# Patient Record
Sex: Female | Born: 1990 | Hispanic: Yes | Marital: Married | Smoking: Never smoker
Health system: Southern US, Community
[De-identification: ages and names within clinical notes are randomized; demographics above are authoritative.]

## PROBLEM LIST (undated history)

## (undated) DIAGNOSIS — O24419 Gestational diabetes mellitus in pregnancy, unspecified control: Secondary | ICD-10-CM

## (undated) HISTORY — DX: Gestational diabetes mellitus in pregnancy, unspecified control: O24.419

## (undated) HISTORY — PX: TONGUE SURGERY: SHX810

## (undated) HISTORY — PX: FACIAL LACERATION REPAIR: SHX6589

---

## 2012-06-15 ENCOUNTER — Observation Stay: Payer: Self-pay

## 2012-06-17 ENCOUNTER — Inpatient Hospital Stay: Payer: Self-pay | Admitting: Obstetrics and Gynecology

## 2012-06-17 LAB — CBC WITH DIFFERENTIAL/PLATELET
Basophil #: 0 10*3/uL (ref 0.0–0.1)
HCT: 31.4 % — ABNORMAL LOW (ref 35.0–47.0)
HGB: 10.4 g/dL — ABNORMAL LOW (ref 12.0–16.0)
Lymphocyte %: 12 %
MCH: 25.7 pg — ABNORMAL LOW (ref 26.0–34.0)
MCHC: 33.1 g/dL (ref 32.0–36.0)
MCV: 78 fL — ABNORMAL LOW (ref 80–100)
Monocyte #: 0.5 x10 3/mm (ref 0.2–0.9)
Monocyte %: 6 %
Neutrophil #: 6.4 10*3/uL (ref 1.4–6.5)
Platelet: 232 10*3/uL (ref 150–440)
WBC: 8 10*3/uL (ref 3.6–11.0)

## 2012-06-19 LAB — HEMATOCRIT: HCT: 26.1 % — ABNORMAL LOW (ref 35.0–47.0)

## 2013-06-28 ENCOUNTER — Ambulatory Visit: Payer: Self-pay | Admitting: Family Medicine

## 2013-06-28 LAB — HCG, QUANTITATIVE, PREGNANCY: Beta Hcg, Quant.: <1 m[IU]/mL — ABNORMAL LOW

## 2014-08-15 NOTE — Discharge Summary (Signed)
Dates of Admission and Diagnosis:  Date of Admission 17-Jun-2012   Date of Discharge 19-Jun-2012   Admitting Diagnosis labor   Final Diagnosis delivery    Chief Complaint/History of Present Illness labor   Chief Complaint/History of Present Illness cont'd labor   Hospital Course:  Hospital Course delivered, no troubles   Condition on Discharge Guarded   DISCHARGE INSTRUCTIONS HOME MEDS:  Medication Reconciliation: Patient's Home Medications at Discharge:     Physician's Instructions:  Diet Regular   Activity Limitations no sex   Return to Work 6 weeks   Time frame for Follow Up Appointment 2-4 weeks  4-6 weeks   Electronic Signatures: Margaretha GlassingEvans, Ricky L (MD)  (Signed 25-Feb-14 08:50)  Authored: ADMISSION DATE AND DIAGNOSIS, CHIEF COMPLAINT/HPI, HOSPITAL COURSE, DISCHARGE INSTRUCTIONS HOME MEDS, PATIENT INSTRUCTIONS   Last Updated: 25-Feb-14 08:50 by Margaretha GlassingEvans, Ricky L (MD)

## 2014-09-02 NOTE — H&P (Signed)
L&D Evaluation:  History:  HPI 24yo G1P0 with PNC at [facility] significant for "PPROM today for clear fluid", pt was seen on 06/15/12 for ?ROM but, had no ferning. Now copious leak. No VB, decreased FM, +UC's q 3-4 mins. PNC significant for Varicella non-immune and Tdap given at 28 wweeks. LMP of 10/03/11 & dating scan at 16 5/7 weeks with EDD of 07/09/12.   Presents with leaking fluid   Patient's Medical History No Chronic Illness   Patient's Surgical History none   Medications Pre Natal Vitamins   Allergies NKDA   Social History none   Family History Non-Contributory   ROS:  ROS All systems were reviewed.  HEENT, CNS, GI, GU, Respiratory, CV, Renal and Musculoskeletal systems were found to be normal.   Exam:  Vital Signs stable  Afebrile   General no apparent distress   Mental Status clear   Chest clear   Heart normal sinus rhythm, no murmur/gallop/rubs   Estimated Fetal Weight Average for gestational age   Fetal Position vtx   Back no CVAT   Reflexes 1+   Pelvic 3/80/vtx-2 very post cx   Mebranes Ruptured   FHT normal rate with no decels, Cat I, Accels seen   Ucx regular   Skin dry   Lymph no lymphadenopathy   Impression:  Impression early labor, IUP at 36 4/7 weeksn with PPROM   Plan:  Plan monitor contractions and for cervical change, Admit for delivery. Plans epidural   Comments Interpreter in with pt during exams   Electronic Signatures: Sharee PimpleJones, Gilad Dugger W (CNM)  (Signed 23-Feb-14 15:16)  Authored: L&D Evaluation   Last Updated: 23-Feb-14 15:16 by Sharee PimpleJones, Meya Clutter W (CNM)

## 2014-09-02 NOTE — H&P (Signed)
L&D Evaluation:  History:  HPI 24yo G1P0 with PNC at [facility] significant for "?ROM on 12 days ago" sent from [facility] for eval. Pt also being treated for yeast. No UC,dcreased fetal movement or vag bleeding today. PNC significant for Varicella non-immune and Tdap given at 28 weeks. LMP  was 10/03/11 & EDD of 07/09/12 confirmed by US at 16 5/7 weeks.   Presents with leaking fluid   Patient's Medical History No Chronic Illness   Patient's Surgical History none   Medications Pre Natal Vitamins   Allergies NKDA   Social History none   Family History Non-Contributory   ROS:  ROS All systems were reviewed.  HEENT, CNS, GI, GU, Respiratory, CV, Renal and Musculoskeletal systems were found to be normal.   Exam:  Vital Signs stable   General no apparent distress   Mental Status clear   Chest clear   Heart normal sinus rhythm, no murmur/gallop/rubs   Abdomen gravid, non-tender   Estimated Fetal Weight Average for gestational age   Fetal Position vtx   Back no CVAT   Edema no edema   Reflexes 1+   Pelvic FT/long   Mebranes Intact, Neg ferning, +sl pos nitrazine but, after exam and bloody dc seen on Qtip   FHT normal rate with no decels   Ucx absent   Skin dry   Lymph no lymphadenopathy   Impression:  Impression IUP at 36 4/7 weeks AFI 9.98   Plan:  Plan EFM/NST   Comments Fern neg with spec exaam. No fluid seen with a cough but, there is clear mucous at the cx os. There is greenish cottage cheese dc on the vag walls. DC after NST reactive. AFI does not indicate Oligo  and there are no signs of ROM today. Pt is afebrile.   Electronic Signatures: Sharee PimpleJones, Torrin Crihfield W (CNM)  (Signed 21-Feb-14 13:11)  Authored: L&D Evaluation   Last Updated: 21-Feb-14 13:11 by Sharee PimpleJones, Fradel Baldonado W (CNM)

## 2016-07-08 ENCOUNTER — Encounter: Payer: Self-pay | Admitting: Certified Nurse Midwife

## 2016-07-15 ENCOUNTER — Encounter: Payer: Self-pay | Admitting: Emergency Medicine

## 2016-07-15 ENCOUNTER — Emergency Department
Admission: EM | Admit: 2016-07-15 | Discharge: 2016-07-15 | Disposition: A | Discharge status: Home / Self Care | Payer: BLUE CROSS/BLUE SHIELD | Attending: Emergency Medicine | Admitting: Emergency Medicine

## 2016-07-15 DIAGNOSIS — R1031 Right lower quadrant pain: Secondary | ICD-10-CM | POA: Diagnosis present

## 2016-07-15 DIAGNOSIS — N390 Urinary tract infection, site not specified: Secondary | ICD-10-CM | POA: Insufficient documentation

## 2016-07-15 LAB — COMPREHENSIVE METABOLIC PANEL
ALT: 28 U/L (ref 14–54)
AST: 25 U/L (ref 15–41)
Albumin: 4.7 g/dL (ref 3.5–5.0)
Alkaline Phosphatase: 77 U/L (ref 38–126)
Anion gap: 9 (ref 5–15)
BUN: 10 mg/dL (ref 6–20)
CHLORIDE: 101 mmol/L (ref 101–111)
CO2: 27 mmol/L (ref 22–32)
Calcium: 9.7 mg/dL (ref 8.9–10.3)
Creatinine, Ser: 0.72 mg/dL (ref 0.44–1.00)
Glucose, Bld: 87 mg/dL (ref 65–99)
POTASSIUM: 4 mmol/L (ref 3.5–5.1)
Sodium: 137 mmol/L (ref 135–145)
Total Bilirubin: 0.5 mg/dL (ref 0.3–1.2)
Total Protein: 8.2 g/dL — ABNORMAL HIGH (ref 6.5–8.1)

## 2016-07-15 LAB — URINALYSIS, COMPLETE (UACMP) WITH MICROSCOPIC
BILIRUBIN URINE: NEGATIVE
Glucose, UA: NEGATIVE mg/dL
KETONES UR: NEGATIVE mg/dL
Nitrite: NEGATIVE
Protein, ur: 30 mg/dL — AB
Specific Gravity, Urine: 1.005 (ref 1.005–1.030)
pH: 7 (ref 5.0–8.0)

## 2016-07-15 LAB — CBC
HEMATOCRIT: 42.4 % (ref 35.0–47.0)
HEMOGLOBIN: 14.5 g/dL (ref 12.0–16.0)
MCH: 29.1 pg (ref 26.0–34.0)
MCHC: 34.3 g/dL (ref 32.0–36.0)
MCV: 84.9 fL (ref 80.0–100.0)
Platelets: 232 10*3/uL (ref 150–440)
RBC: 4.99 MIL/uL (ref 3.80–5.20)
RDW: 13.2 % (ref 11.5–14.5)
WBC: 6.7 10*3/uL (ref 3.6–11.0)

## 2016-07-15 LAB — LIPASE, BLOOD: LIPASE: 25 U/L (ref 11–51)

## 2016-07-15 MED ORDER — CEPHALEXIN 500 MG PO CAPS
ORAL_CAPSULE | ORAL | Status: AC
  Administered 2016-07-15: 500 mg via ORAL
  Filled 2016-07-15: qty 1

## 2016-07-15 MED ORDER — CEPHALEXIN 500 MG PO CAPS
500.0000 mg | ORAL_CAPSULE | Freq: Once | ORAL | Status: AC
Start: 2016-07-15 — End: 2016-07-15
  Administered 2016-07-15: 500 mg via ORAL

## 2016-07-15 MED ORDER — CEPHALEXIN 500 MG PO CAPS: 500.0000 mg | ORAL_CAPSULE | Freq: Two times a day (BID) | ORAL | 0 refills | Status: DC

## 2016-07-15 NOTE — ED Notes (Signed)
Electronic signature pad not working at this time. Interpreter used. Patient verbalizes understanding of discharge instructions. All questions answered.

## 2016-07-15 NOTE — ED Notes (Signed)
With the use of the interpreter the patient explained that this has been an ongoing pain for 1 month but started getting significantly worse on Monday.  Patient started vomiting Monday after each meal.  Patient states that after she eats she has sharp pain,  bloating, and nausea.

## 2016-07-15 NOTE — ED Provider Notes (Signed)
[facility] Emergency Department Provider Note   ____________________________________________    I have reviewed the triage vital signs and the nursing notes.   HISTORY  Chief Complaint Emesis and Abdominal Pain  Spanish interpreter used   HPI Tracy Mata is a 26 y.o. female who presents with complaints of right lower abdominal pain primarily over the last week. She saw her PCP who gave her nausea medication because she had been having mild nausea with it but she reports the pain is continued. She denies fevers or chills. She does report dysuria and frequency. No back pain.   History reviewed. No pertinent past medical history.  There are no active problems to display for this patient.   History reviewed. No pertinent surgical history.  Prior to Admission medications   Medication Sig Start Date End Date Taking? Authorizing Provider  cephALEXin (KEFLEX) 500 MG capsule Take 1 capsule (500 mg total) by mouth 2 (two) times daily. 07/15/16   Jene Everyobert Zamara Cozad, MD     Allergies Patient has no known allergies.  No family history on file.  Social History Social History  Substance Use Topics  . Smoking status: Never Smoker  . Smokeless tobacco: Never Used  . Alcohol use No    Review of Systems  Constitutional: No fever/chills  Cardiovascular: Denies chest pain. Respiratory: Denies shortness of breath. Gastrointestinal:As above Genitourinary: As above, no vaginal discharge Musculoskeletal: Negative for back pain. Skin: Negative for rash. Neurological: Negative for headaches  10-point ROS otherwise negative.  ____________________________________________   PHYSICAL EXAM:  VITAL SIGNS: ED Triage Vitals [07/15/16 1705]  Enc Vitals Group     BP 114/89     Pulse Rate 97     Resp 16     Temp 98.5 F (36.9 C)     Temp Source Oral     SpO2 100 %     Weight 145 lb (65.8 kg)     Height      Head Circumference      Peak Flow        Pain Score 9     Pain Loc      Pain Edu?      Excl. in GC?     Constitutional: Alert and oriented. No acute distress. Pleasant and interactive Eyes: Conjunctivae are normal.   Nose: No congestion/rhinnorhea. Mouth/Throat: Mucous membranes are moist.    Cardiovascular: Normal rate, regular rhythm. Grossly normal heart sounds.  Good peripheral circulation. Respiratory: Normal respiratory effort.  No retractions. Lungs CTAB. Gastrointestinal: Mild tenderness to palpation suprapubically and in the right lower quadrant No distention.  No CVA tenderness. Genitourinary: deferred Musculoskeletal:  Warm and well perfused Neurologic:  Normal speech and language. No gross focal neurologic deficits are appreciated.  Skin:  Skin is warm, dry and intact. No rash noted. Psychiatric: Mood and affect are normal. Speech and behavior are normal.  ____________________________________________   LABS (all labs ordered are listed, but only abnormal results are displayed)  Labs Reviewed  COMPREHENSIVE METABOLIC PANEL - Abnormal; Notable for the following:       Result Value   Total Protein 8.2 (*)    All other components within normal limits  URINALYSIS, COMPLETE (UACMP) WITH MICROSCOPIC - Abnormal; Notable for the following:    Color, Urine YELLOW (*)    APPearance TURBID (*)    Hgb urine dipstick SMALL (*)    Protein, ur 30 (*)    Leukocytes, UA LARGE (*)    Bacteria, UA  MANY (*)    Squamous Epithelial / LPF TOO NUMEROUS TO COUNT (*)    All other components within normal limits  LIPASE, BLOOD  CBC   ____________________________________________  EKG  None ____________________________________________  RADIOLOGY  None ____________________________________________   PROCEDURES  Procedure(s) performed: No    Critical Care performed: No ____________________________________________   INITIAL IMPRESSION / ASSESSMENT AND PLAN / ED COURSE  Pertinent labs & imaging results that  were available during my care of the patient were reviewed by me and considered in my medical decision making (see chart for details).  Patient well-appearing and in no acute distress. Vital signs normal. Exam significant for mild to palpation suprapubically and in the right lower quadrant. Lab work is unremarkable except for large leukocytes/white blood cells in urine. History of present illness is not consistent with appendicitis. I suspect urinary tract infection as the cause of her pain. We will treat with by mouth Keflex with strict return precautions if any worsening symptoms. Patient is comfortable with this plan ____________________________________________   FINAL CLINICAL IMPRESSION(S) / ED DIAGNOSES  Final diagnoses:  Lower urinary tract infectious disease      NEW MEDICATIONS STARTED DURING THIS VISIT:  New Prescriptions   CEPHALEXIN (KEFLEX) 500 MG CAPSULE    Take 1 capsule (500 mg total) by mouth 2 (two) times daily.     Note:  This document was prepared using Dragon voice recognition software and may include unintentional dictation errors.    Jene Every, MD 07/15/16 [phone]

## 2016-07-15 NOTE — ED Notes (Signed)
ED Provider at bedside. 

## 2016-07-15 NOTE — ED Triage Notes (Signed)
Pt states that she was seen by her doctor on Tuesday for abdominal pain, N/V. Pt states that she was told to come to ED if her pain got worse. Pt states that she has been unable to keep any food down, only water. Pt denies fever or diarrhea. Pt states that she feels like her stomach is swollen.

## 2017-04-25 NOTE — L&D Delivery Note (Addendum)
Delivery Note  [phone] Called in room to see patient, head crowning. Effective maternal pushing efforts noted.   Spontaneous vaginal birth of liveborn female infant (gestational age: 6139 weeks) in right occiput anterior position, through single loose nuchal cord, over intact perineum at 0841. Infant immediately to maternal abdomen. Delayed cord clamping, three (3) vessel cord, and cord blood tubes collected. APGARs: 7, 9. Weight pending. Receiving nurse present at bedside for birth.   Pitocin infusing. Spontaneous delivery of intact placenta at 0846. Vaginal skidmarks hemostatic, unrepaired. EBL: 150 ml. Epidural anesthesia. Vault check completed. Counts correct x 2. Uterus firm. Lochia small.   Initiate routine postpartum care and orders. Mom to postpartum.  Baby to Couplet care / Skin to Skin.  Family members present at bedside and overjoyed with the birth of "Tracy Mata".    Gunnar BullaJenkins Michelle Dorr Perrot, CNM [facility], [facility] 01/14/2018, 9:03 AM

## 2017-07-19 ENCOUNTER — Other Ambulatory Visit (INDEPENDENT_AMBULATORY_CARE_PROVIDER_SITE_OTHER): Payer: BLUE CROSS/BLUE SHIELD

## 2017-07-19 ENCOUNTER — Ambulatory Visit (INDEPENDENT_AMBULATORY_CARE_PROVIDER_SITE_OTHER): Payer: BLUE CROSS/BLUE SHIELD | Admitting: Certified Nurse Midwife

## 2017-07-19 ENCOUNTER — Encounter: Payer: Self-pay | Admitting: Certified Nurse Midwife

## 2017-07-19 ENCOUNTER — Other Ambulatory Visit: Payer: Self-pay | Admitting: Certified Nurse Midwife

## 2017-07-19 VITALS — BP 116/70 | HR 112 | Ht 60.0 in | Wt 147.5 lb

## 2017-07-19 DIAGNOSIS — N926 Irregular menstruation, unspecified: Secondary | ICD-10-CM

## 2017-07-19 DIAGNOSIS — Z3481 Encounter for supervision of other normal pregnancy, first trimester: Secondary | ICD-10-CM | POA: Diagnosis not present

## 2017-07-19 LAB — POCT URINALYSIS DIPSTICK
Bilirubin, UA: NEGATIVE
Blood, UA: NEGATIVE
Glucose, UA: NEGATIVE
KETONES UA: NEGATIVE
Leukocytes, UA: NEGATIVE
NITRITE UA: NEGATIVE
PH UA: 6 (ref 5.0–8.0)
Protein, UA: NEGATIVE
SPEC GRAV UA: 1.01 (ref 1.010–1.025)
UROBILINOGEN UA: 0.2 U/dL

## 2017-07-19 LAB — OB RESULTS CONSOLE VARICELLA ZOSTER ANTIBODY, IGG: Varicella: IMMUNE

## 2017-07-19 LAB — OB RESULTS CONSOLE HEPATITIS B SURFACE ANTIGEN: Hepatitis B Surface Ag: NEGATIVE

## 2017-07-19 NOTE — Progress Notes (Signed)
Pt is here for a new OB visit. Denies N/V. LMP 04/16/17. [facility].Pt states she feels like something falling out especially at work.

## 2017-07-19 NOTE — Progress Notes (Signed)
NEW OB HISTORY AND PHYSICAL  SUBJECTIVE:       Tracy Mata is a 27 y.o. G1P0 female, Patient's last menstrual period was 04/16/2017 (approximate)., Estimated Date of Delivery: None noted., Unknown, presents today for establishment of Prenatal Care. She has no unusual complaints.    Gynecologic History Patient's last menstrual period was 04/16/2017 (approximate). Normal Contraception: none Last Pap:2018 per pt. Results were: normal  Obstetric History OB History  Gravida Para Term Preterm AB Living  2 1   1   1   SAB TAB Ectopic Multiple Live Births          1    # Outcome Date GA Lbr Len/2nd Weight Sex Delivery Anes PTL Lv  2 Current           1 Preterm 2014   8 lb 11 oz (3.941 kg) M Vag-Spont  Y LIV    History reviewed. No pertinent past medical history.  Past Surgical History:  Procedure Laterality Date  . FACIAL LACERATION REPAIR    . TONGUE SURGERY      Current Outpatient Medications on File Prior to Visit  Medication Sig Dispense Refill  . Prenatal Vit-Fe Fumarate-FA (PRENATAL MULTIVITAMIN) TABS tablet Take 1 tablet by mouth daily at 12 noon.     No current facility-administered medications on file prior to visit.     No Known Allergies  Social History   Socioeconomic History  . Marital status: Married    Spouse name: Not on file  . Number of children: Not on file  . Years of education: Not on file  . Highest education level: Not on file  Occupational History  . Not on file  Social Needs  . Financial resource strain: Not on file  . Food insecurity:    Worry: Not on file    Inability: Not on file  . Transportation needs:    Medical: Not on file    Non-medical: Not on file  Tobacco Use  . Smoking status: Never Smoker  . Smokeless tobacco: Never Used  Substance and Sexual Activity  . Alcohol use: No  . Drug use: Never  . Sexual activity: Yes    Birth control/protection: None  Lifestyle  . Physical activity:    Days per week: Not on file     Minutes per session: Not on file  . Stress: Not on file  Relationships  . Social connections:    Talks on phone: Not on file    Gets together: Not on file    Attends religious service: Not on file    Active member of club or organization: Not on file    Attends meetings of clubs or organizations: Not on file    Relationship status: Not on file  . Intimate partner violence:    Fear of current or ex partner: Not on file    Emotionally abused: Not on file    Physically abused: Not on file    Forced sexual activity: Not on file  Other Topics Concern  . Not on file  Social History Narrative  . Not on file    Family History  Problem Relation Age of Onset  . Heart disease Paternal Grandmother   . Diabetes Paternal Grandfather     The following portions of the patient's history were reviewed and updated as appropriate: allergies, current medications, past OB history, past medical history, past surgical history, past family history, past social history, and problem list.    OBJECTIVE: Initial Physical  Exam (New OB)  GENERAL APPEARANCE: alert, well appearing, in no apparent distress, oriented to person, place and time, overweight HEAD: normocephalic, atraumatic MOUTH: mucous membranes moist, pharynx normal without lesions THYROID: no thyromegaly or masses present BREASTS: no masses noted, no significant tenderness, no palpable axillary nodes, no skin changes LUNGS: clear to auscultation, no wheezes, rales or rhonchi, symmetric air entry HEART: regular rate and rhythm, no murmurs ABDOMEN: soft, nontender, nondistended, no abnormal masses, no epigastric pain, obese and FHT present EXTREMITIES: no redness or tenderness in the calves or thighs, no edema, no limitation in range of motion, intact peripheral pulses SKIN: normal coloration and turgor, no rashes LYMPH NODES: no adenopathy palpable NEUROLOGIC: alert, oriented, normal speech, no focal findings or movement disorder  noted  PELVIC EXAM EXTERNAL GENITALIA: normal appearing vulva with no masses, tenderness or lesions VAGINA: no abnormal discharge or lesions CERVIX: no lesions or cervical motion tenderness UTERUS: gravid ADNEXA: no masses palpable and nontender OB EXAM PELVIMETRY: appears adequate RECTUM: exam not indicated  ASSESSMENT: Normal pregnancy  PLAN: New OB counseling: The patient has been given an overview regarding routine prenatal care. Recommendations regarding diet, weight gain, and exercise in pregnancy were given. Prenatal testing, optional genetic testing, and ultrasound use in pregnancy were reviewed.  Benefits of Breast Feeding were discussed. Discussed use of 17 P given her history of preterm birth @ 8 months. Teacher, English as a foreign language given. The patient is encouraged to consider nursing her baby post partum.    New ob H&P completed with use of spanish interpretor.   Doreene Burke, CNM

## 2017-07-19 NOTE — Patient Instructions (Signed)
Eating Plan for Pregnant Women While you are pregnant, your body will require additional nutrition to help support your growing baby. It is recommended that you consume:  150 additional calories each day during your first trimester.  300 additional calories each day during your second trimester.  300 additional calories each day during your third trimester.  Eating a healthy, well-balanced diet is very important for your health and for your baby's health. You also have a higher need for some vitamins and minerals, such as folic acid, calcium, iron, and vitamin D. What do I need to know about eating during pregnancy?  Do not try to lose weight or go on a diet during pregnancy.  Choose healthy, nutritious foods. Choose  of a sandwich with a glass of milk instead of a candy bar or a high-calorie sugar-sweetened beverage.  Limit your overall intake of foods that have "empty calories." These are foods that have little nutritional value, such as sweets, desserts, candies, sugar-sweetened beverages, and fried foods.  Eat a variety of foods, especially fruits and vegetables.  Take a prenatal vitamin to help meet the additional needs during pregnancy, specifically for folic acid, iron, calcium, and vitamin D.  Remember to stay active. Ask your health care provider for exercise recommendations that are specific to you.  Practice good food safety and cleanliness, such as washing your hands before you eat and after you prepare raw meat. This helps to prevent foodborne illnesses, such as listeriosis, that can be very dangerous for your baby. Ask your health care provider for more information about listeriosis. What does 150 extra calories look like? Healthy options for an additional 150 calories each day could be any of the following:  Plain low-fat yogurt (6-8 oz) with  cup of berries.  1 apple with 2 teaspoons of peanut butter.  Cut-up vegetables with  cup of hummus.  Low-fat chocolate milk  (8 oz or 1 cup).  1 string cheese with 1 medium orange.   of a peanut butter and jelly sandwich on whole-wheat bread (1 tsp of peanut butter).  For 300 calories, you could eat two of those healthy options each day. What is a healthy amount of weight to gain? The recommended amount of weight for you to gain is based on your pre-pregnancy BMI. If your pre-pregnancy BMI was:  Less than 18 (underweight), you should gain 28-40 lb.  18-24.9 (normal), you should gain 25-35 lb.  25-29.9 (overweight), you should gain 15-25 lb.  Greater than 30 (obese), you should gain 11-20 lb.  What if I am having twins or multiples? Generally, pregnant women who will be having twins or multiples may need to increase their daily calories by 300-600 calories each day. The recommended range for total weight gain is 25-54 lb, depending on your pre-pregnancy BMI. Talk with your health care provider for specific guidance about additional nutritional needs, weight gain, and exercise during your pregnancy. What foods can I eat? Grains Any grains. Try to choose whole grains, such as whole-wheat bread, oatmeal, or brown rice. Vegetables Any vegetables. Try to eat a variety of colors and types of vegetables to get a full range of vitamins and minerals. Remember to wash your vegetables well before eating. Fruits Any fruits. Try to eat a variety of colors and types of fruit to get a full range of vitamins and minerals. Remember to wash your fruits well before eating. Meats and Other Protein Sources Lean meats, including chicken, turkey, fish, and lean cuts of beef, veal,   or pork. Make sure that all meats are cooked to "well done." Tofu. Tempeh. Beans. Eggs. Peanut butter and other nut butters. Seafood, such as shrimp, crab, and lobster. If you choose fish, select types that are higher in omega-3 fatty acids, including salmon, herring, mussels, trout, sardines, and pollock. Make sure that all meats are cooked to food-safe  temperatures. Dairy Pasteurized milk and milk alternatives. Pasteurized yogurt and pasteurized cheese. Cottage cheese. Sour cream. Beverages Water. Juices that contain 100% fruit juice or vegetable juice. Caffeine-free teas and decaffeinated coffee. Drinks that contain caffeine are okay to drink, but it is better to avoid caffeine. Keep your total caffeine intake to less than 200 mg each day (12 oz of coffee, tea, or soda) or as directed by your health care provider. Condiments Any pasteurized condiments. Sweets and Desserts Any sweets and desserts. Fats and Oils Any fats and oils. The items listed above may not be a complete list of recommended foods or beverages. Contact your dietitian for more options. What foods are not recommended? Vegetables Unpasteurized (raw) vegetable juices. Fruits Unpasteurized (raw) fruit juices. Meats and Other Protein Sources Cured meats that have nitrates, such as bacon, salami, and hotdogs. Luncheon meats, bologna, or other deli meats (unless they are reheated until they are steaming hot). Refrigerated pate, meat spreads from a meat counter, smoked seafood that is found in the refrigerated section of a store. Raw fish, such as sushi or sashimi. High mercury content fish, such as tilefish, shark, swordfish, and king mackerel. Raw meats, such as tuna or beef tartare. Undercooked meats and poultry. Make sure that all meats are cooked to food-safe temperatures. Dairy Unpasteurized (raw) milk and any foods that have raw milk in them. Soft cheeses, such as feta, queso blanco, queso fresco, Brie, Camembert cheeses, blue-veined cheeses, and Panela cheese (unless it is made with pasteurized milk, which must be stated on the label). Beverages Alcohol. Sugar-sweetened beverages, such as sodas, teas, or energy drinks. Condiments Homemade fermented foods and drinks, such as pickles, sauerkraut, or kombucha drinks. (Store-bought pasteurized versions of these are  okay.) Other Salads that are made in the store, such as ham salad, chicken salad, egg salad, tuna salad, and seafood salad. The items listed above may not be a complete list of foods and beverages to avoid. Contact your dietitian for more information. This information is not intended to replace advice given to you by your health care provider. Make sure you discuss any questions you have with your health care provider. Document Released: 01/24/2014 Document Revised: 09/17/2015 Document Reviewed: 09/24/2013 Elsevier Interactive Patient Education  2018 [employer] Prenatal Care WHAT IS PRENATAL CARE? Prenatal care is the process of caring for a pregnant woman before she gives birth. Prenatal care makes sure that she and her baby remain as healthy as possible throughout pregnancy. Prenatal care may be provided by a midwife, family practice health care provider, or a childbirth and pregnancy specialist (obstetrician). Prenatal care may include physical examinations, testing, treatments, and education on nutrition, lifestyle, and social support services. WHY IS PRENATAL CARE SO IMPORTANT? Early and consistent prenatal care increases the chance that you and your baby will remain healthy throughout your pregnancy. This type of care also decreases a baby's risk of being born too early (prematurely), or being born smaller than expected (small for gestational age). Any underlying medical conditions you may have that could pose a risk during your pregnancy are discussed during prenatal care visits. You will also be monitored regularly for any new   conditions that may arise during your pregnancy so they can be treated quickly and effectively. WHAT HAPPENS DURING PRENATAL CARE VISITS? Prenatal care visits may include the following: Discussion Tell your health care provider about any new signs or symptoms you have experienced since your last visit. These might include:  Nausea or vomiting.  Increased or  decreased level of energy.  Difficulty sleeping.  Back or leg pain.  Weight changes.  Frequent urination.  Shortness of breath with physical activity.  Changes in your skin, such as the development of a rash or itchiness.  Vaginal discharge or bleeding.  Feelings of excitement or nervousness.  Changes in your baby's movements.  You may want to write down any questions or topics you want to discuss with your health care provider and bring them with you to your appointment. Examination During your first prenatal care visit, you will likely have a complete physical exam. Your health care provider will often examine your vagina, cervix, and the position of your uterus, as well as check your heart, lungs, and other body systems. As your pregnancy progresses, your health care provider will measure the size of your uterus and your baby's position inside your uterus. He or she may also examine you for early signs of labor. Your prenatal visits may also include checking your blood pressure and, after about 10-12 weeks of pregnancy, listening to your baby's heartbeat. Testing Regular testing often includes:  Urinalysis. This checks your urine for glucose, protein, or signs of infection.  Blood count. This checks the levels of white and red blood cells in your body.  Tests for sexually transmitted infections (STIs). Testing for STIs at the beginning of pregnancy is routinely done and is required in many states.  Antibody testing. You will be checked to see if you are immune to certain illnesses, such as rubella, that can affect a developing fetus.  Glucose screen. Around 24-28 weeks of pregnancy, your blood glucose level will be checked for signs of gestational diabetes. Follow-up tests may be recommended.  Group B strep. This is a bacteria that is commonly found inside a woman's vagina. This test will inform your health care provider if you need an antibiotic to reduce the amount of this  bacteria in your body prior to labor and childbirth.  Ultrasound. Many pregnant women undergo an ultrasound screening around 18-20 weeks of pregnancy to evaluate the health of the fetus and check for any developmental abnormalities.  HIV (human immunodeficiency virus) testing. Early in your pregnancy, you will be screened for HIV. If you are at high risk for HIV, this test may be repeated during your third trimester of pregnancy.  You may be offered other testing based on your age, personal or family medical history, or other factors. HOW OFTEN SHOULD I PLAN TO SEE MY HEALTH CARE PROVIDER FOR PRENATAL CARE? Your prenatal care check-up schedule depends on any medical conditions you have before, or develop during, your pregnancy. If you do not have any underlying medical conditions, you will likely be seen for checkups:  Monthly, during the first 6 months of pregnancy.  Twice a month during months 7 and 8 of pregnancy.  Weekly starting in the 9th month of pregnancy and until delivery.  If you develop signs of early labor or other concerning signs or symptoms, you may need to see your health care provider more often. Ask your health care provider what prenatal care schedule is best for you. WHAT CAN I DO TO KEEP MYSELF AND   MY BABY AS HEALTHY AS POSSIBLE DURING MY PREGNANCY?  Take a prenatal vitamin containing 400 micrograms (0.4 mg) of folic acid every day. Your health care provider may also ask you to take additional vitamins such as iodine, vitamin D, iron, copper, and zinc.  Take 1500-2000 mg of calcium daily starting at your 20th week of pregnancy until you deliver your baby.  Make sure you are up to date on your vaccinations. Unless directed otherwise by your health care provider: ? You should receive a tetanus, diphtheria, and pertussis (Tdap) vaccination between the 27th and 36th week of your pregnancy, regardless of when your last Tdap immunization occurred. This helps protect your baby  from whooping cough (pertussis) after he or she is born. ? You should receive an annual inactivated influenza vaccine (IIV) to help protect you and your baby from influenza. This can be done at any point during your pregnancy.  Eat a well-rounded diet that includes: ? Fresh fruits and vegetables. ? Lean proteins. ? Calcium-rich foods such as milk, yogurt, hard cheeses, and dark, leafy greens. ? Whole grain breads.  Do noteat seafood high in mercury, including: ? Swordfish. ? Tilefish. ? Shark. ? King mackerel. ? More than 6 oz tuna per week.  Do not eat: ? Raw or undercooked meats or eggs. ? Unpasteurized foods, such as soft cheeses (brie, blue, or feta), juices, and milks. ? Lunch meats. ? Hot dogs that have not been heated until they are steaming.  Drink enough water to keep your urine clear or pale yellow. For many women, this may be 10 or more 8 oz glasses of water each day. Keeping yourself hydrated helps deliver nutrients to your baby and may prevent the start of pre-term uterine contractions.  Do not use any tobacco products including cigarettes, chewing tobacco, or electronic cigarettes. If you need help quitting, ask your health care provider.  Do not drink beverages containing alcohol. No safe level of alcohol consumption during pregnancy has been determined.  Do not use any illegal drugs. These can harm your developing baby or cause a miscarriage.  Ask your health care provider or pharmacist before taking any prescription or over-the-counter medicines, herbs, or supplements.  Limit your caffeine intake to no more than 200 mg per day.  Exercise. Unless told otherwise by your health care provider, try to get 30 minutes of moderate exercise most days of the week. Do not  do high-impact activities, contact sports, or activities with a high risk of falling, such as horseback riding or downhill skiing.  Get plenty of rest.  Avoid anything that raises your body temperature,  such as hot tubs and saunas.  If you own a cat, do not empty its litter box. Bacteria contained in cat feces can cause an infection called toxoplasmosis. This can result in serious harm to the fetus.  Stay away from chemicals such as insecticides, lead, mercury, and cleaning or paint products that contain solvents.  Do not have any X-rays taken unless medically necessary.  Take a childbirth and breastfeeding preparation class. Ask your health care provider if you need a referral or recommendation.  This information is not intended to replace advice given to you by your health care provider. Make sure you discuss any questions you have with your health care provider. Document Released: 04/14/2003 Document Revised: 09/14/2015 Document Reviewed: 06/26/2013 Elsevier Interactive Patient Education  2017 [employer]  

## 2017-07-20 ENCOUNTER — Other Ambulatory Visit: Payer: BLUE CROSS/BLUE SHIELD

## 2017-07-20 LAB — MICROSCOPIC EXAMINATION: CASTS: NONE SEEN /LPF

## 2017-07-20 LAB — URINALYSIS, ROUTINE W REFLEX MICROSCOPIC
Bilirubin, UA: NEGATIVE
Glucose, UA: NEGATIVE
KETONES UA: NEGATIVE
Nitrite, UA: NEGATIVE
Protein, UA: NEGATIVE
RBC, UA: NEGATIVE
SPEC GRAV UA: 1.009 (ref 1.005–1.030)
Urobilinogen, Ur: 0.2 mg/dL (ref 0.2–1.0)
pH, UA: 6.5 (ref 5.0–7.5)

## 2017-07-20 LAB — CBC WITH DIFFERENTIAL
BASOS ABS: 0 10*3/uL (ref 0.0–0.2)
Basos: 0 %
EOS (ABSOLUTE): 0.1 10*3/uL (ref 0.0–0.4)
Eos: 1 %
HEMOGLOBIN: 12.4 g/dL (ref 11.1–15.9)
Hematocrit: 36.9 % (ref 34.0–46.6)
IMMATURE GRANS (ABS): 0.1 10*3/uL (ref 0.0–0.1)
IMMATURE GRANULOCYTES: 1 %
LYMPHS: 13 %
Lymphocytes Absolute: 1.1 10*3/uL (ref 0.7–3.1)
MCH: 29.3 pg (ref 26.6–33.0)
MCHC: 33.6 g/dL (ref 31.5–35.7)
MCV: 87 fL (ref 79–97)
MONOCYTES: 7 %
Monocytes Absolute: 0.6 10*3/uL (ref 0.1–0.9)
NEUTROS PCT: 78 %
Neutrophils Absolute: 6.5 10*3/uL (ref 1.4–7.0)
RBC: 4.23 x10E6/uL (ref 3.77–5.28)
RDW: 14 % (ref 12.3–15.4)
WBC: 8.4 10*3/uL (ref 3.4–10.8)

## 2017-07-20 LAB — URINE CULTURE: ORGANISM ID, BACTERIA: NO GROWTH

## 2017-07-20 LAB — HEPATITIS B SURFACE ANTIGEN: HEP B S AG: NEGATIVE

## 2017-07-20 LAB — ABO AND RH: Rh Factor: POSITIVE

## 2017-07-20 LAB — VARICELLA ZOSTER ANTIBODY, IGG: Varicella zoster IgG: 218 index (ref >165)

## 2017-07-20 LAB — RUBELLA SCREEN: Rubella Antibodies, IGG: 4.8 index (ref >0.99)

## 2017-07-20 LAB — ANTIBODY SCREEN: ANTIBODY SCREEN: NEGATIVE

## 2017-07-20 LAB — RPR: RPR Ser Ql: NONREACTIVE

## 2017-07-20 LAB — HIV ANTIBODY (ROUTINE TESTING W REFLEX): HIV SCREEN 4TH GENERATION: NONREACTIVE

## 2017-07-21 LAB — MONITOR DRUG PROFILE 14(MW)
Amphetamine Scrn, Ur: NEGATIVE ng/mL
BARBITURATE SCREEN URINE: NEGATIVE ng/mL
BENZODIAZEPINE SCREEN, URINE: NEGATIVE ng/mL
BUPRENORPHINE, URINE: NEGATIVE ng/mL
CANNABINOIDS UR QL SCN: NEGATIVE ng/mL
COCAINE(METAB.)SCREEN, URINE: NEGATIVE ng/mL
CREATININE(CRT), U: 32.3 mg/dL (ref 20.0–300.0)
Fentanyl, Urine: NEGATIVE pg/mL
METHADONE SCREEN, URINE: NEGATIVE ng/mL
Meperidine Screen, Urine: NEGATIVE ng/mL
OXYCODONE+OXYMORPHONE UR QL SCN: NEGATIVE ng/mL
Opiate Scrn, Ur: NEGATIVE ng/mL
PH UR, DRUG SCRN: 6.3 (ref 4.5–8.9)
PHENCYCLIDINE QUANTITATIVE URINE: NEGATIVE ng/mL
Propoxyphene Scrn, Ur: NEGATIVE ng/mL
SPECIFIC GRAVITY: 1.009
Tramadol Screen, Urine: NEGATIVE ng/mL

## 2017-07-21 LAB — NICOTINE SCREEN, URINE: COTININE UR QL SCN: NEGATIVE ng/mL

## 2017-08-16 ENCOUNTER — Ambulatory Visit (INDEPENDENT_AMBULATORY_CARE_PROVIDER_SITE_OTHER): Payer: BLUE CROSS/BLUE SHIELD | Admitting: Obstetrics and Gynecology

## 2017-08-16 VITALS — BP 106/59 | HR 111 | Wt 149.6 lb

## 2017-08-16 DIAGNOSIS — Z3492 Encounter for supervision of normal pregnancy, unspecified, second trimester: Secondary | ICD-10-CM

## 2017-08-16 NOTE — Progress Notes (Signed)
ROB- pt is doing well, having some sinus issues, was given safe medication sheet

## 2017-08-16 NOTE — Progress Notes (Signed)
ROB-discussed 17P (orderd)and is OK to start.could not verify FHR- will return tomorrow for u/s.

## 2017-08-17 ENCOUNTER — Ambulatory Visit (INDEPENDENT_AMBULATORY_CARE_PROVIDER_SITE_OTHER): Payer: BLUE CROSS/BLUE SHIELD

## 2017-08-17 ENCOUNTER — Ambulatory Visit (INDEPENDENT_AMBULATORY_CARE_PROVIDER_SITE_OTHER): Payer: BLUE CROSS/BLUE SHIELD | Admitting: Obstetrics and Gynecology

## 2017-08-17 DIAGNOSIS — Z3492 Encounter for supervision of normal pregnancy, unspecified, second trimester: Secondary | ICD-10-CM

## 2017-08-17 DIAGNOSIS — Z3402 Encounter for supervision of normal first pregnancy, second trimester: Secondary | ICD-10-CM

## 2017-08-17 NOTE — Progress Notes (Signed)
Reviewed ultrasound: Indications: Growth/Viability; Could not obtain FHT in office yesterday Findings:  Singleton intrauterine pregnancy is visualized with FHR at 152 BPM. Biometrics give an (U/S) Gestational age of 27 6/7 weeks and an (U/S) EDD of 01/19/18; this correlates with the clinically established EDD of 01/21/18.  Fetal presentation is footling breech.  EFW: 211 grams (0lb 7oz). Placenta: Anterior and grade 1. AFI: WNL subjectively.  Fetal stomach and bladder appear WNL.   17-Ohp ordered and will call for first injection next week, and will plan future appointments on the same day Called patient. Advised patient of provider's approval for requested procedure, as well as any comments/instructions from provider.    Patient verbalized understanding .

## 2017-09-01 ENCOUNTER — Ambulatory Visit (INDEPENDENT_AMBULATORY_CARE_PROVIDER_SITE_OTHER): Payer: BLUE CROSS/BLUE SHIELD | Admitting: Obstetrics and Gynecology

## 2017-09-01 VITALS — BP 101/61 | HR 90 | Ht 60.0 in | Wt 151.3 lb

## 2017-09-01 DIAGNOSIS — Z3402 Encounter for supervision of normal first pregnancy, second trimester: Secondary | ICD-10-CM

## 2017-09-01 LAB — POCT URINALYSIS DIPSTICK
BILIRUBIN UA: NEGATIVE
Blood, UA: NEGATIVE
GLUCOSE UA: NEGATIVE
Ketones, UA: NEGATIVE
LEUKOCYTES UA: NEGATIVE
Nitrite, UA: NEGATIVE
Protein, UA: NEGATIVE
Spec Grav, UA: 1.01 (ref 1.010–1.025)
Urobilinogen, UA: 0.2 E.U./dL
pH, UA: 7.5 (ref 5.0–8.0)

## 2017-09-01 MED ORDER — HYDROXYPROGESTERONE CAPROATE 275 MG/1.1ML ~~LOC~~ SOAJ
275.0000 mg | Freq: Once | SUBCUTANEOUS | Status: AC
  Administered 2017-09-01: 275 mg via SUBCUTANEOUS

## 2017-09-01 NOTE — Progress Notes (Signed)
Pt presents for #    1 inj 17P. No contractions or bleeding. Next inj due 1 week. 

## 2017-09-08 ENCOUNTER — Ambulatory Visit (INDEPENDENT_AMBULATORY_CARE_PROVIDER_SITE_OTHER): Payer: BLUE CROSS/BLUE SHIELD | Admitting: Obstetrics and Gynecology

## 2017-09-08 DIAGNOSIS — Z3492 Encounter for supervision of normal pregnancy, unspecified, second trimester: Secondary | ICD-10-CM

## 2017-09-08 NOTE — Progress Notes (Signed)
Pt presents for 2nd 17P inj. Per [facility] - Shanon Rosser has been trying to contact pt to verify her insurance and payment info with no response. Informed pt that she will need to reach out to [facility] (number provided by [facility]) and get payment figured out before they can send [place] the next inj. Pt states she has an ROB next week and will try to get it worked out before then. Advised her to let us know if we need to help in anyway. Pt voices understanding.

## 2017-09-14 ENCOUNTER — Other Ambulatory Visit: Payer: Self-pay | Admitting: Certified Nurse Midwife

## 2017-09-14 ENCOUNTER — Ambulatory Visit (INDEPENDENT_AMBULATORY_CARE_PROVIDER_SITE_OTHER): Payer: BLUE CROSS/BLUE SHIELD | Admitting: Certified Nurse Midwife

## 2017-09-14 ENCOUNTER — Other Ambulatory Visit (INDEPENDENT_AMBULATORY_CARE_PROVIDER_SITE_OTHER): Payer: BLUE CROSS/BLUE SHIELD

## 2017-09-14 VITALS — BP 99/63 | HR 102 | Wt 156.1 lb

## 2017-09-14 DIAGNOSIS — O09899 Supervision of other high risk pregnancies, unspecified trimester: Secondary | ICD-10-CM

## 2017-09-14 DIAGNOSIS — Z3402 Encounter for supervision of normal first pregnancy, second trimester: Secondary | ICD-10-CM

## 2017-09-14 DIAGNOSIS — Z8759 Personal history of other complications of pregnancy, childbirth and the puerperium: Secondary | ICD-10-CM

## 2017-09-14 DIAGNOSIS — Z3689 Encounter for other specified antenatal screening: Secondary | ICD-10-CM

## 2017-09-14 DIAGNOSIS — O09219 Supervision of pregnancy with history of pre-term labor, unspecified trimester: Secondary | ICD-10-CM

## 2017-09-14 LAB — POCT URINALYSIS DIPSTICK
BILIRUBIN UA: NEGATIVE
Glucose, UA: NEGATIVE
Ketones, UA: NEGATIVE
Leukocytes, UA: NEGATIVE
Nitrite, UA: NEGATIVE
PROTEIN UA: POSITIVE — AB
RBC UA: NEGATIVE
Spec Grav, UA: 1.015 (ref 1.010–1.025)
UROBILINOGEN UA: 0.2 U/dL
pH, UA: 7 (ref 5.0–8.0)

## 2017-09-14 NOTE — Patient Instructions (Signed)
Informacin sobre parto y [place] de parto prematuros Preterm Labor and Birth Information El embarazo tiene generalmente una duracin de 39 a 41 semanas. El [place] de parto es prematuro cuando se inicia muy pronto. Comienza antes de completar las 37 semanas de [place]. Cules son los factores de riesgo del [place] de [place] prematuro? Existen mayores probabilidades de trabajo de parto prematuro en mujeres con las siguientes caractersticas:  Tuvieron una infeccin Solicitor.  El cuello uterino es corto.  Tuvieron trabajo de parto prematuro anteriormente.  Se sometieron a una ciruga en el cuello uterino.  Son menores de 17aos.  Tienen ms de 35aos.  Son afroamericanas.  Estn embarazadas de dos o ms bebs.  Consumen drogas mientras estn embarazadas.  Fuman mientras estn embarazadas.  No aumentan de peso lo suficiente durante el [employer].  Se embarazaron inmediatamente despus de Psychologist, clinical.  Cules son los sntomas del [place] de [place] prematuro? Los sntomas del trabajo de parto prematuro incluyen lo siguiente:  Marketing executive. Los calambres pueden parecerse a los que tiene una mujer durante el perodo menstrual. Los calambres pueden presentarse con diarrea.  Dolor de vientre (abdomen).  Dolor en la zona lumbar.  Tiene contracciones regulares o endurecimiento del tero. Siente como si el vientre se endurece.  Presin en la zona inferior del vientre que Futures trader.  Pierde ms lquido (secrecin) por la vagina. El lquido puede ser acuoso o con [place].  Ruptura de la bolsa de aguas.  Por qu es importante notar los signos del [place] de [place] prematuro? Los bebs que nacen antes de tiempo pueden no estar completamente desarrollados. Estos pueden tener un riesgo mayor de padecer:  Problemas cardacos a Barrister's clerk.  Problemas pulmonares a Barrister's clerk.  Dificultades para controlar los sistemas corporales, por ejemplo, respirar.  Hemorragia  cerebral.  Una afeccin que se denomina parlisis cerebral.  Dificultades en el aprendizaje.  Muerte.  Estos riesgos son [employer] para bebs que nacen antes de las 34semanas de [place]. Cmo se trata Leander Rams de parto prematuro? El tratamiento depende de lo siguiente:  El tiempo de [place].  Su estado de [place].  La salud del beb.  El tratamiento puede incluir lo siguiente:  Un punto (sutura) en el cuello uterino. Al parir, el cuello uterino se abre para que el beb pueda salir. El punto impide que el cuello uterino se abra antes de [place].  Permanecer en el hospital.  Tomar medicamentos como, por ejemplo: ? Medicamentos hormonales. ? Medicamentos para Scientist, water quality las contracciones. ? Medicamentos para ayudar a la maduracin de los pulmones del beb. ? Medicamentos para evitar que el beb desarrolle parlisis cerebral.  Qu debo hacer si estoy en [place] prematuro? Si cree que est en trabajo de parto demasiado pronto, llame a su mdico de inmediato. Cmo puedo prevenir el trabajo de parto prematuro?  No use productos que contengan tabaco. ? Estos incluyen cigarrillos, tabaco para Higher education careers adviser y Psychologist, sport and exercise. ? Si necesita ayuda para dejar de fumar, consulte al mdico.  No consuma drogas.  No tome ningn medicamento si el mdico no se lo indic.  Consulte al mdico antes de empezar a tomar cualquier suplemento de hierbas.  Asegrese aumentar de peso como corresponde.  Tenga cuidado con las infecciones. Si cree que puede tener una infeccin, consulte al mdico para que la revisen inmediatamente.  Infrmele al mdico si ha tenido trabajo de parto prematuro anteriormente. Esta informacin no tiene Marine scientist el consejo del mdico. Asegrese de hacerle al mdico  cualquier pregunta que tenga. Document Released: 05/14/2010 Document Revised: 07/20/2016 Document Reviewed: 09/02/2015 Elsevier Interactive Patient Education  2018 [employer]. Common Medications Safe in Pregnancy  Acne:      Constipation:  Benzoyl Peroxide     Colace  Clindamycin      Dulcolax Suppository  [place]ica Erythromycin     Fibercon  Salicylic Acid      Metamucil         Miralax AVOID:        Senakot   Accutane    Cough:  Retin-A       Cough Drops  Tetracycline      Phenergan w/ Codeine if Rx  Minocycline      Robitussin (Plain & DM)  Antibiotics:     Crabs/Lice:  Ceclor       RID  Cephalosporins    AVOID:  E-Mycins      Kwell  Keflex  Macrobid/Macrodantin   Diarrhea:  Penicillin      Kao-Pectate  Zithromax      Imodium AD         PUSH FLUIDS AVOID:       Cipro     Fever:  Tetracycline      Tylenol (Regular or Extra  Minocycline       Strength)  Levaquin      Extra Strength-Do not          Exceed 8 tabs/24 hrs Caffeine:        <259m/day (equiv. To 1 cup of coffee or  approx. 3 12 oz sodas)         Gas: Cold/Hayfever:       Gas-X  Benadryl      Mylicon  Claritin       Phazyme  **Claritin-D        Chlor-Trimeton    Headaches:  Dimetapp      ASA-Free Excedrin  Drixoral-Non-Drowsy     Cold Compress  Mucinex (Guaifenasin)     Tylenol (Regular or Extra  Sudafed/Sudafed-12 Hour     Strength)  **Sudafed PE Pseudoephedrine   Tylenol Cold & Sinus     Vicks Vapor Rub  Zyrtec  **AVOID if Problems With Blood Pressure         Heartburn: Avoid lying down for at least 1 hour after meals  Aciphex      Maalox     Rash:  Milk of Magnesia     Benadryl    Mylanta       1% Hydrocortisone Cream  Pepcid  Pepcid Complete   Sleep Aids:  Prevacid      Ambien   Prilosec       Benadryl  Rolaids       Chamomile Tea  Tums (Limit 4/day)     Unisom  Zantac       Tylenol PM         Warm milk-add vanilla or  Hemorrhoids:       Sugar for taste  Anusol/Anusol H.C.  (RX: Analapram 2.5%)  Sugar Substitutes:  Hydrocortisone OTC     Ok in moderation  Preparation H      Tucks        Vaseline lotion applied to tissue with  wiping    Herpes:     Throat:  Acyclovir      Oragel  Famvir  Valtrex     Vaccines:         Flu Shot Leg Cramps:       *  Gardasil  Benadryl      Hepatitis A         Hepatitis B Nasal Spray:       Pneumovax  Saline Nasal Spray     Polio Booster         Tetanus Nausea:       Tuberculosis test or PPD  Vitamin B6 25 mg TID   AVOID:    Dramamine      *Gardasil  Emetrol       Live Poliovirus  Ginger Root 250 mg QID    MMR (measles, mumps &  High Complex Carbs @ Bedtime    rebella)  Sea Bands-Accupressure    Varicella (Chickenpox)  Unisom 1/2 tab TID     *No known complications           If received before Pain:         Known pregnancy;   Darvocet       Resume series after  Lortab        Delivery  Percocet    Yeast:   Tramadol      Femstat  Tylenol 3      Gyne-lotrimin  Ultram       Monistat  Vicodin           MISC:         All Sunscreens           Hair Coloring/highlights          Insect Repellant's          (Including DEET)         [place] trimestre de Media planner (Second Trimester of Pregnancy) El segundo trimestre va desde la semana13 hasta la 83, desde el cuarto hasta el sexto mes, y suele ser el momento en el que mejor se siente. En general, las nuseas matutinas han disminuido o han desaparecido completamente. Tendr ms energa y podr aumentarle el apetito. El beb por nacer (feto) se desarrolla rpidamente. Hacia el final del sexto mes, el beb mide aproximadamente 9 pulgadas (23 cm) y pesa alrededor de 1 libras (700 g). Es probable que sienta al beb moverse (dar pataditas) entre las 18 y 25 semanas del Media planner. CUIDADOS EN EL HOGAR  No fume, no consuma hierbas ni beba alcohol. No tome frmacos que el mdico no haya autorizado.  No consuma ningn producto que contenga tabaco, lo que incluye cigarrillos, tabaco de Higher education careers adviser o Psychologist, sport and exercise. Si necesita ayuda para dejar de fumar, consulte al [employer]. Puede recibir asesoramiento u otro tipo de apoyo para  dejar de fumar.  Tome los medicamentos solamente como se lo haya indicado el mdico. Algunos medicamentos son seguros para tomar durante el Media planner y otros no lo son.  Haga ejercicios solamente como se lo haya indicado el mdico. Interrumpa la actividad fsica si comienza a tener calambres.  Ingiera alimentos saludables de [place] regular.  Use un sostn que le brinde buen soporte si sus mamas estn sensibles.  No se d baos de inmersin en agua caliente, baos turcos ni saunas.  Colquese el cinturn de seguridad cuando conduzca.  No coma carne cruda ni queso sin cocinar; evite el contacto con las bandejas sanitarias de los gatos y la tierra que estos animales usan.  [place].  Tome entre 1500 y 2060m de calcio diariamente comenzando en la [id number] embarazo [place]  Pruebe tomar un medicamento que la ayude a defecar (un laxante suave) si el mdico lo autoriza. Consuma ms  fibra, que se encuentra en las frutas y verduras frescas y los cereales integrales. Beba suficiente lquido para mantener el pis (orina) claro o de color amarillo plido.  Dese baos de asiento con agua tibia para Best boy o las molestias causadas por las hemorroides. Use una crema para las hemorroides si el mdico la autoriza.  Si se le hinchan las venas (venas varicosas), use medias de descanso. Levante (eleve) los pies durante 38mnutos, 3 o 4veces por dTraining and development officer Limite el consumo de sal en su dieta.  No levante objetos pesados, use zapatos de tacones bajos y sintese derecha.  Descanse con las piernas elevadas si tiene calambres o dolor de cintura.  Visite a su dentista si no lo ha hQuarry manager Use un cepillo de cerdas suaves para cepillarse los dientes. Psese el hilo dental con suavidad.  Puede seguir mAmerican Electric Power a menos que el mdico le indique lo contrario.  Concurra a los controles mdicos.  SOLICITE AYUDA SI:  Siente  mareos.  Sufre calambres o presin leves en la parte baja del vientre (abdomen).  Sufre un dolor persistente en el abdomen.  Tiene mHigher education careers adviser(nuseas), vmitos, o tiene deposiciones acuosas (diarrea).  Advierte un olor ftido que proviene de la vagina.  Siente dolor al oContinental Airlines  SOLICITE AYUDA DE INMEDIATO SI:  Tiene fiebre.  Tiene una prdida de lquido por la vagina.  Tiene sangrado o pequeas prdidas vaginales.  Siente dolor intenso o clicos en el abdomen.  Sube o baja de peso rpidamente.  Tiene dificultades para recuperar el aliento y siente dolor en el pecho.  Sbitamente se le hinchan mucho el rostro, las [place] los tobillos, los pies o las piernas.  No ha sentido los movimientos del beb durante uLeone Brand  Siente un dolor de cabeza intenso que no se alivia con medicamentos.  Su visin se modifica.  Esta informacin no tiene cMarine scientistel consejo del mdico. Asegrese de hacerle al mdico cualquier pregunta que tenga. Document Released: 12/12/2012 Document Revised: 05/02/2014 Document Reviewed: 06/12/2012 Elsevier Interactive Patient Education  2017 [employer]

## 2017-09-14 NOTE — Progress Notes (Signed)
Pt is here for an ROB visit. 

## 2017-09-15 DIAGNOSIS — Z8759 Personal history of other complications of pregnancy, childbirth and the puerperium: Secondary | ICD-10-CM | POA: Insufficient documentation

## 2017-09-15 DIAGNOSIS — Z8751 Personal history of pre-term labor: Secondary | ICD-10-CM | POA: Insufficient documentation

## 2017-09-15 NOTE — Progress Notes (Signed)
ROB-Doing well, reports round ligament pain and intermittent fatigue. Discussed home treatment measures including the use of abdominal support; handouts given. 17p arrives tomorrow, will schedule appt for next week. Anatomy scan today complete and normal; findings reviewed with patient and verbalizes understanding. Reviewed red flag symptoms and when to call. RTC x Tuesday for 17p injection. RTC x 3-4 weeks for ROB with Pattricia Boss or sooner if needed.   ULTRASOUND REPORT  Location: [facility] Date of Service:  09/14/2017  Indications: Anatomy Findings:  Mason Jim intrauterine pregnancy is visualized with FHR at 153 BPM. Biometrics give an (U/S) Gestational age of 33 3/7 weeks and an (U/S) EDD of 01/22/18; this correlates with the clinically established EDD of 01/21/18.  Fetal presentation is vertex.  EFW: 440 grams (1lb 0oz). Placenta: Anterior and grade 1. AFI: WNL subjectively.  Anatomic survey is complete and appears WNL; Gender - Female. ** Lateral ventricle measures at the upper limits of normal at an average of 9.3 mm **   Right Ovary was not visualized. Left Ovary measures 3.0 x 2.2 x 2.2 cm. It is normal appearance. There is no obvious evidence of a corpus luteal cyst. Survey of the adnexa demonstrates no adnexal masses. There is no free peritoneal fluid in the cul de sac.  Impression: 1. 21 3/7 week Viable Singleton Intrauterine pregnancy by U/S. 2. (U/S) EDD is consistent with Clinically established (LMP) EDD of 01/21/18. 3. Normal Anatomy Scan - Lateral ventricle measures at the upper limits of normal at an average of 9.3 mm  Recommendations: 1.Clinical correlation with the patient's History and Physical Exam.

## 2017-09-19 ENCOUNTER — Ambulatory Visit (INDEPENDENT_AMBULATORY_CARE_PROVIDER_SITE_OTHER): Payer: BLUE CROSS/BLUE SHIELD | Admitting: Certified Nurse Midwife

## 2017-09-19 ENCOUNTER — Encounter: Payer: Self-pay | Admitting: Certified Nurse Midwife

## 2017-09-19 DIAGNOSIS — O09219 Supervision of pregnancy with history of pre-term labor, unspecified trimester: Secondary | ICD-10-CM | POA: Diagnosis not present

## 2017-09-19 DIAGNOSIS — O09899 Supervision of other high risk pregnancies, unspecified trimester: Secondary | ICD-10-CM

## 2017-09-19 MED ORDER — HYDROXYPROGESTERONE CAPROATE 275 MG/1.1ML ~~LOC~~ SOAJ
275.0000 mg | Freq: Once | SUBCUTANEOUS | Status: AC
  Administered 2017-09-19: 275 mg via SUBCUTANEOUS

## 2017-09-19 NOTE — Progress Notes (Signed)
Pt presents for #   2  inj 17P. No contractions or bleeding. Next inj due 1 week. 

## 2017-09-19 NOTE — Progress Notes (Signed)
I have reviewed the record and concur with patient management and plan of care.    Wilberta Dorvil Michelle Micael Barb, CNM [facility] 

## 2017-09-26 ENCOUNTER — Ambulatory Visit (INDEPENDENT_AMBULATORY_CARE_PROVIDER_SITE_OTHER): Payer: BLUE CROSS/BLUE SHIELD | Admitting: Certified Nurse Midwife

## 2017-09-26 VITALS — BP 105/73 | HR 100 | Wt 159.1 lb

## 2017-09-26 DIAGNOSIS — O09219 Supervision of pregnancy with history of pre-term labor, unspecified trimester: Secondary | ICD-10-CM

## 2017-09-26 DIAGNOSIS — O09899 Supervision of other high risk pregnancies, unspecified trimester: Secondary | ICD-10-CM

## 2017-09-26 MED ORDER — HYDROXYPROGESTERONE CAPROATE 275 MG/1.1ML ~~LOC~~ SOAJ
275.0000 mg | Freq: Once | SUBCUTANEOUS | Status: AC
  Administered 2017-09-26: 275 mg via SUBCUTANEOUS

## 2017-09-26 NOTE — Progress Notes (Signed)
I have reviewed the record and concur with patient management and plan of care.    Bonnie Roig Michelle Jayde Daffin, CNM [facility] 

## 2017-09-26 NOTE — Progress Notes (Signed)
Pt presents for #  4   inj 17P. No contractions or bleeding. Next inj due 1 week. States her right arm was itchy after last injection.

## 2017-09-29 ENCOUNTER — Other Ambulatory Visit: Payer: Self-pay | Admitting: Certified Nurse Midwife

## 2017-09-29 DIAGNOSIS — O289 Unspecified abnormal findings on antenatal screening of mother: Secondary | ICD-10-CM

## 2017-10-03 ENCOUNTER — Ambulatory Visit (INDEPENDENT_AMBULATORY_CARE_PROVIDER_SITE_OTHER): Payer: BLUE CROSS/BLUE SHIELD | Admitting: Certified Nurse Midwife

## 2017-10-03 VITALS — BP 107/78 | HR 102 | Ht 60.0 in | Wt 156.7 lb

## 2017-10-03 DIAGNOSIS — L539 Erythematous condition, unspecified: Secondary | ICD-10-CM

## 2017-10-03 MED ORDER — HYDROXYPROGESTERONE CAPROATE 275 MG/1.1ML ~~LOC~~ SOAJ
275.0000 mg | Freq: Once | SUBCUTANEOUS | Status: AC
  Administered 2017-10-03: 275 mg via SUBCUTANEOUS

## 2017-10-03 NOTE — Progress Notes (Signed)
PT states that she has localized reaction to 17 P injections. She complains of itching and some swelling. Encouraged use of Bendryl prior to injection and to ice the area afterwards. Reviewed Red flag symptoms. Follow up as scheduled.   Doreene BurkeAnnie Garnet Overfield, CNM

## 2017-10-03 NOTE — Progress Notes (Signed)
Pt is here for 17 p injection, she is having some type of reaction just localized at injection site,"states it itches and her arm will get red", discussed this with Doreene BurkeAnnie Thompson, CNM advised her to take a benadryl prior to next injection and let us know how she does with this injection.  Pt voiced understanding.

## 2017-10-10 ENCOUNTER — Ambulatory Visit (INDEPENDENT_AMBULATORY_CARE_PROVIDER_SITE_OTHER): Payer: BLUE CROSS/BLUE SHIELD | Admitting: Certified Nurse Midwife

## 2017-10-10 ENCOUNTER — Encounter: Payer: Self-pay | Admitting: Certified Nurse Midwife

## 2017-10-10 VITALS — BP 102/63 | HR 105 | Wt 161.4 lb

## 2017-10-10 DIAGNOSIS — O09899 Supervision of other high risk pregnancies, unspecified trimester: Secondary | ICD-10-CM

## 2017-10-10 DIAGNOSIS — O09219 Supervision of pregnancy with history of pre-term labor, unspecified trimester: Secondary | ICD-10-CM

## 2017-10-10 LAB — POCT URINALYSIS DIPSTICK
BILIRUBIN UA: NEGATIVE
Blood, UA: NEGATIVE
Glucose, UA: NEGATIVE
Ketones, UA: NEGATIVE
Leukocytes, UA: NEGATIVE
Nitrite, UA: NEGATIVE
PH UA: 7.5 (ref 5.0–8.0)
Protein, UA: POSITIVE — AB
SPEC GRAV UA: 1.015 (ref 1.010–1.025)
UROBILINOGEN UA: 0.2 U/dL

## 2017-10-10 MED ORDER — HYDROXYPROGESTERONE CAPROATE 275 MG/1.1ML ~~LOC~~ SOAJ
275.0000 mg | Freq: Once | SUBCUTANEOUS | Status: AC
  Administered 2017-10-10: 275 mg via SUBCUTANEOUS

## 2017-10-10 NOTE — Patient Instructions (Signed)
Hydroxyprogesterone solution for injection What is this medicine? HYDROXYPROGESTERONE (hye drox ee proe JES ter one) is a female hormone. This medicine is used in women who are pregnant and who have delivered a baby too early (preterm) in the past. It helps lower the risk of having a preterm baby again. This medicine may be used for other purposes; ask your health care provider or pharmacist if you have questions. COMMON BRAND NAME(S): Makena What should I tell my health care provider before I take this medicine? They need to know if you have any of these conditions: -blood clotting disorders -breast, cervical, uterine, or vaginal cancer -depression -diabetes or prediabetes -heart disease -high blood pressure -kidney disease -liver disease -lung or breathing disease, like asthma -migraine headaches -seizures -vaginal bleeding -an unusual or allergic reaction to hydroxyprogesterone, other hormones, medicines, foods, dyes, castor oil, benzyl alcohol, or other preservatives -breast-feeding How should I use this medicine? This medicine is for injection into a muscle. It is given by a health care professional in a hospital or clinic setting. You are likely to get an injection once a week to prevent preterm delivery. Talk to your pediatrician regarding the use of this medicine in children. Special care may be needed. Overdosage: If you think you have taken too much of this medicine contact a poison control center or emergency room at once. NOTE: This medicine is only for you. Do not share this medicine with others. What if I miss a dose? It is important not to miss your dose. Call your doctor or health care professional if you are unable to keep an appointment. What may interact with this medicine? -acetaminophen -bupropion -clozapine -efavirenz -halothane -methadone -nicotine -theophylline, aminophylline -tizanidine This list may not describe all possible interactions. Give your  health care provider a list of all the medicines, herbs, non-prescription drugs, or dietary supplements you use. Also tell them if you smoke, drink alcohol, or use illegal drugs. Some items may interact with your medicine. What should I watch for while using this medicine? Your condition will be monitored carefully while you are receiving this medicine. What side effects may I notice from receiving this medicine? Side effects that you should report to your doctor or health care professional as soon as possible: -allergic reactions like skin rash, itching or hives, swelling of the face, lips, or tongue -breathing problems -breast tissue changes or discharge -changes in vision -confusion, trouble speaking or understanding -depressed mood -increased hunger or thirst -increased urination -pain, redness, or irritation at site where injected -pain, swelling, warmth in the leg -shortness of breath, chest pain, swelling in a leg -sudden numbness or weakness of the face, arm or leg -sudden severe headaches -trouble walking, dizziness, loss of balance or coordination -unusually weak or tired -vaginal bleeding -yellowing of the eyes or skin Side effects that usually do not require medical attention (report to your doctor or health care professional if they continue or are bothersome): -changes in emotions or moods -diarrhea -fluid retention and swelling -nausea This list may not describe all possible side effects. Call your doctor for medical advice about side effects. You may report side effects to FDA at 1-800-FDA-1088. Where should I keep my medicine? This drug is given in a hospital or clinic and will not be stored at home. NOTE: This sheet is a summary. It may not cover all possible information. If you have questions about this medicine, talk to your doctor, pharmacist, or health care provider.  2018 Elsevier/Gold Standard (2009-06-02 11:17:12)  

## 2017-10-10 NOTE — Addendum Note (Signed)
Addended by: Shaune SpittleLAWHORN, Shantella Blubaugh M on: 10/10/2017 04:54 PM   Modules accepted: Level of Service

## 2017-10-10 NOTE — Progress Notes (Signed)
Pt presents for # 6    inj 17P. No contractions or bleeding. Next inj due 1 week. 

## 2017-10-10 NOTE — Progress Notes (Signed)
I have reviewed the record and concur with patient management and plan.    Ladell Bey Michelle Kehinde Bowdish, CNM [facility] 

## 2017-10-11 ENCOUNTER — Telehealth: Payer: Self-pay

## 2017-10-11 NOTE — Telephone Encounter (Signed)
[facility] pt has to give consent to have it shipped. Call placed to pt- she was given the telephone number- and she repeated it back to this Clinical research associatewriter. She will contact to give consent. She was reminded to bring it with her at next appointment.

## 2017-10-13 ENCOUNTER — Ambulatory Visit (INDEPENDENT_AMBULATORY_CARE_PROVIDER_SITE_OTHER): Payer: BLUE CROSS/BLUE SHIELD

## 2017-10-13 ENCOUNTER — Telehealth: Payer: Self-pay

## 2017-10-13 ENCOUNTER — Ambulatory Visit (INDEPENDENT_AMBULATORY_CARE_PROVIDER_SITE_OTHER): Payer: BLUE CROSS/BLUE SHIELD | Admitting: Certified Nurse Midwife

## 2017-10-13 VITALS — BP 105/64 | HR 105 | Wt 161.4 lb

## 2017-10-13 DIAGNOSIS — O288 Other abnormal findings on antenatal screening of mother: Secondary | ICD-10-CM

## 2017-10-13 DIAGNOSIS — Z3402 Encounter for supervision of normal first pregnancy, second trimester: Secondary | ICD-10-CM

## 2017-10-13 DIAGNOSIS — Z3A26 26 weeks gestation of pregnancy: Secondary | ICD-10-CM | POA: Diagnosis not present

## 2017-10-13 DIAGNOSIS — O289 Unspecified abnormal findings on antenatal screening of mother: Secondary | ICD-10-CM

## 2017-10-13 DIAGNOSIS — O09899 Supervision of other high risk pregnancies, unspecified trimester: Secondary | ICD-10-CM

## 2017-10-13 DIAGNOSIS — O09219 Supervision of pregnancy with history of pre-term labor, unspecified trimester: Secondary | ICD-10-CM

## 2017-10-13 LAB — POCT URINALYSIS DIPSTICK
Bilirubin, UA: NEGATIVE
Blood, UA: NEGATIVE
Glucose, UA: NEGATIVE
Ketones, UA: NEGATIVE
LEUKOCYTES UA: NEGATIVE
NITRITE UA: NEGATIVE
PROTEIN UA: NEGATIVE
SPEC GRAV UA: 1.01 (ref 1.010–1.025)
Urobilinogen, UA: 0.2 E.U./dL
pH, UA: 7 (ref 5.0–8.0)

## 2017-10-13 MED ORDER — HYDROXYPROGESTERONE CAPROATE 275 MG/1.1ML ~~LOC~~ SOAJ
275.0000 mg | Freq: Once | SUBCUTANEOUS | Status: AC
  Administered 2017-10-24: 275 mg via SUBCUTANEOUS

## 2017-10-13 NOTE — Progress Notes (Signed)
Pt is here for an ROB visit and 17P inj. Pt did not bring inj.

## 2017-10-13 NOTE — Progress Notes (Signed)
Interpretor present for visit.  ROB, doing well. Reviewed u/s results. Discussed 28 wk visit. Feels good movement. Follow up 2 wks .   Doreene BurkeAnnie Shaely Gadberry, CNM   ULTRASOUND REPORT  Location: [facility] Date of Service:  10/13/2017  Indications: Growth; F/U lateral ventricle Findings:  Mason JimSingleton intrauterine pregnancy is visualized with FHR at 143 BPM. Biometrics give an (U/S) Gestational age of 27 4/7 weeks and an (U/S) EDD of 01/15/18; this correlates with the clinically established EDD of 01/21/18.  Fetal presentation is vertex.  EFW: 1060 grams (2lb 5oz).  71st percentile.  AC measures 99th percentile. Placenta: Anterior and grade 1-2. AFI: WNL with MVP measuring 4.9 cm.  The lateral ventricle measures 8.3 mm on today's scan compared to 9.3 mm on the anatomy scan.  Impression: 1. 26 4/7 week Viable Singleton Intrauterine pregnancy by U/S. 2. (U/S) EDD is consistent with Clinically established (LMP) EDD of 01/21/18. 3. EFW: 1060 grams (2lb 5oz).  71st percentile.  AC measures 99th percentile. 4. The lateral ventricle measures smaller on today's exam at 8.3 mm.  Recommendations: 1.Clinical correlation with the patient's History and Physical Exam.   Kari BaarsJill Long, RDMS

## 2017-10-13 NOTE — Patient Instructions (Signed)
Postprandial Glucose Test The postprandial glucose test is used to screen for diabetes. This test involves collecting a sample of your blood 2 hours after you have eaten a meal. The sample is used to determine your blood sugar (glucose) level. Your health care provider may recommend that you have this test if he or she thinks that you may have diabetes. The test can help confirm the diagnosis. How do I prepare for this test? You will need to eat a meal that includes at least 75 milligrams (mg) of carbohydrate. The meal should include high-carbohydrate foods, such as breads, pasta, potatoes, and starchy foods. After eating this meal, you should eat nothing else until after the blood sample is taken 2 hours later. Make sure you avoid snacking during this time. Eating anything can interfere with test results. Do not smoke or exercise during the testing period. What do the results mean? It is your responsibility to obtain your test results. Ask the lab or department performing the test when and how you will get your results. Contact your health care provider to discuss any questions you have about your results. Range of Normal Values Ranges for normal values may vary among different labs and hospitals. You should always check with your health care provider after having lab work or other tests done to discuss whether your values are considered within normal limits. Normal levels of blood glucose are as follows:  130-27 years old: less than 140 mg/dL or less than 7.8 mmol/L (SI units).  6750-27 years old: less than 150 mg/dL.  10651 years old and older: less than 160 mg/dL.  Some situations can interfere with the results of the postprandial glucose test. These may include:  Experiencing high levels of stress around the time of the test.  Eating a snack or candy during the testing time.  Exercising during the testing time.  Being unable to eat a full, carbohydrate-rich meal before the test.  Meaning of  Results Outside Normal Value Ranges Test results that are above normal values may indicate a number of health problems, such as:  Diabetes mellitus.  Diabetes that develops during pregnancy (gestational diabetes).  Malnutrition.  Hyperthyroidism.  Acute stress response.  Cushing syndrome.  Tumors such as pheochromocytoma or glucagonoma.  Kidney failure.  Acromegaly.  Liver disease.  Use of medicines such as diuretics or corticosteroids.  Discuss your test results with your health care provider. He or she will use the results to make a diagnosis and determine a treatment plan that is right for you. Talk with your health care provider to discuss your results, treatment options, and if necessary, the need for more tests. Talk with your health care provider if you have any questions about your results. This information is not intended to replace advice given to you by your health care provider. Make sure you discuss any questions you have with your health care provider. Document Released: 05/04/2004 Document Revised: 12/16/2015 Document Reviewed: 08/16/2013 Elsevier Interactive Patient Education  [employer]2018 Elsevier Inc.

## 2017-10-13 NOTE — Telephone Encounter (Signed)
Pt and interpreter gave consent to [employer] to have another shipment of Makena sent to this office. The representative I spoke with said this consent will have to be done each and everytime. There is a zero copay for this pt. Pt has an appointment scheduled for 10/17/17 for injection. She will be 1 week behind as a result of delayed shipment. Will be here in office 10/16/17 by 1030 per rep.

## 2017-10-17 ENCOUNTER — Ambulatory Visit (INDEPENDENT_AMBULATORY_CARE_PROVIDER_SITE_OTHER): Payer: BLUE CROSS/BLUE SHIELD | Admitting: Certified Nurse Midwife

## 2017-10-17 VITALS — BP 103/70 | HR 116 | Wt 161.9 lb

## 2017-10-17 DIAGNOSIS — Z3A27 27 weeks gestation of pregnancy: Secondary | ICD-10-CM | POA: Diagnosis not present

## 2017-10-17 DIAGNOSIS — O09212 Supervision of pregnancy with history of pre-term labor, second trimester: Secondary | ICD-10-CM | POA: Diagnosis not present

## 2017-10-17 DIAGNOSIS — O09213 Supervision of pregnancy with history of pre-term labor, third trimester: Secondary | ICD-10-CM

## 2017-10-17 MED ORDER — HYDROXYPROGESTERONE CAPROATE 275 MG/1.1ML ~~LOC~~ SOAJ
275.0000 mg | Freq: Once | SUBCUTANEOUS | Status: AC
  Administered 2017-10-17: 275 mg via SUBCUTANEOUS

## 2017-10-17 NOTE — Progress Notes (Signed)
Pt is here for her 17P injection, states she is still getting a rash however is taking the benadryl and its helping some

## 2017-10-24 ENCOUNTER — Ambulatory Visit (INDEPENDENT_AMBULATORY_CARE_PROVIDER_SITE_OTHER): Payer: BLUE CROSS/BLUE SHIELD | Admitting: Certified Nurse Midwife

## 2017-10-24 ENCOUNTER — Other Ambulatory Visit: Payer: BLUE CROSS/BLUE SHIELD

## 2017-10-24 VITALS — BP 97/58 | HR 102 | Wt 164.0 lb

## 2017-10-24 DIAGNOSIS — Z3492 Encounter for supervision of normal pregnancy, unspecified, second trimester: Secondary | ICD-10-CM

## 2017-10-24 LAB — POCT URINALYSIS DIPSTICK
BILIRUBIN UA: NEGATIVE
Blood, UA: NEGATIVE
GLUCOSE UA: NEGATIVE
Ketones, UA: NEGATIVE
Leukocytes, UA: NEGATIVE
Nitrite, UA: NEGATIVE
Protein, UA: POSITIVE — AB
Spec Grav, UA: 1.01 (ref 1.010–1.025)
UROBILINOGEN UA: 0.2 U/dL
pH, UA: 7.5 (ref 5.0–8.0)

## 2017-10-24 MED ORDER — TETANUS-DIPHTH-ACELL PERTUSSIS 5-2.5-18.5 LF-MCG/0.5 IM SUSP
0.5000 mL | Freq: Once | INTRAMUSCULAR | Status: AC
  Administered 2017-10-24: 0.5 mL via INTRAMUSCULAR

## 2017-10-24 MED ORDER — HYDROXYPROGESTERONE CAPROATE 275 MG/1.1ML ~~LOC~~ SOAJ
275.0000 mg | Freq: Once | SUBCUTANEOUS | Status: AC
  Administered 2017-11-13: 275 mg via SUBCUTANEOUS

## 2017-10-24 NOTE — Progress Notes (Signed)
ROB-Reports round ligament pain. Discussed home treatment measures. TDaP, 17P, and 28 week labs today.. Plans epidural and breast/formula feeding. Requests work note for pregnancy precautions, given. Reviewed red flag symptoms and when to call. RTC weekly for 17P. RTC x 2 weeks for ROB or sooner if needed. Interpreter present for today's visit

## 2017-10-24 NOTE — Patient Instructions (Signed)
Dolor abdominal durante el embarazo (Abdominal Pain During Pregnancy) El dolor de vientre (abdominal) es habitual durante el embarazo. Generalmente no se trata de un problema grave. Otras veces puede ser un signo de que algo no anda bien. Siempre comunquese con su mdico si tiene dolor abdominal. CUIDADOS EN EL HOGAR Controle el dolor para ver si hay cambios. Las indicaciones que siguen pueden ayudarla a sentirse mejor:  Hospital doctorotenga sexo (relaciones sexuales) ni se coloque nada dentro de la vagina hasta que se sienta mejor.  Haga reposo hasta que el dolor se calme.  Si siente ganas de vomitar (nuseas ) beba lquidos claros. No consuma alimentos slidos hasta que se sienta mejor.  Slo tome los medicamentos que le haya indicado su mdico.  Cumpla con las visitas al mdico segn las indicaciones. SOLICITE AYUDA DE INMEDIATO SI:  Tiene un sangrado, pierde lquido o elimina trozos de tejido por la vagina.  Siente ms dolor o clicos.  Comienza a vomitar.  Siente dolor al orinar u observa sangre en la orina.  Tiene fiebre.  No siente que el beb se mueva mucho.  Se siente muy dbil o cree que va a desmayarse.  Tiene dificultad para respirar con o sin dolor en el vientre.  Siente un dolor de cabeza muy intenso y Engineer, miningdolor en el vientre.  Observa que sale un lquido por la vagina y tiene dolor abdominal.  La materia fecal es lquida (diarrea).  El dolor en el viente no desaparece, o empeora, luego de hacer reposo. ASEGRESE DE QUE:  Comprende estas instrucciones.  Controlar su afeccin.  Recibir ayuda de inmediato si no mejora o si empeora. Esta informacin no tiene Theme park managercomo fin reemplazar el consejo del mdico. Asegrese de hacerle al mdico cualquier pregunta que tenga. Document Released: 12/22/2010 Document Revised: 08/03/2015 Document Reviewed: 11/08/2012 Elsevier Interactive Patient Education  2018 [employer]. Dolor de espalda durante el embarazo (Back Pain in  Pregnancy) El dolor de espalda es habitual durante el [place]. Puede deberse a varios factores relacionados con los cambios durante esta etapa. INSTRUCCIONES PARA EL CUIDADO EN EL HOGAR Control del dolor, la rigidez y la hinchazn  Si se lo indican, aplique hielo en caso de dolor de espalda repentino (agudo). ? Ponga el hielo en una bolsa plstica. ? Coloque una FirstEnergy Corptoalla entre la piel y la bolsa de hielo. ? Coloque el hielo durante 20minutos, 2 a 3veces por da.  Si se lo indican, aplique calor en la zona afectada antes de realizar ejercicios: ? Coloque una toalla entre la piel y la compresa de calor o la almohadilla trmica. ? Aplique el calor durante 20 a 30minutos. ? Retire la fuente de calor si la piel se le pone de color rojo brillante. Esto es muy importante si no puede sentir el dolor, el calor o el fro. Puede correr un riesgo mayor de sufrir quemaduras. Actividad  Haga ejercicio como se lo haya indicado el mdico. Hacer ejercicio es la mejor forma de Automotive engineerevitar o Human resources officercontrolar el dolor de espalda.  Prstele atencin a su cuerpo cuando se levante. Si siente dolor al levantarse, pida ayuda o flexione las rodillas. Liberty GlobalDe este modo, se usan los msculos de las piernas en lugar de los de la espalda.  Pngase en cuclillas al levantar algo del suelo. No se agache.  Haga reposo en cama nicamente como se lo haya indicado el mdico. El reposo en cama solo debe hacerse cuando los episodios de dolor de espalda son ms intensos. Pararse, sentarse y acostarse  No permanezca  sentada o de pie en el mismo lugar durante largos perodos.  Cuando est sentada, adopte una Education officer, museum. Asegrese de que su cabeza descansa sobre sus hombros y no est colgando hacia delante. Use una almohada en la parte inferior de la espalda si es necesario.  Trate de dormir de lado, de preferencia el lado izquierdo, con una o [employer] piernas. Si est dolorida despus de una noche de descanso, la cama  puede ser [employer]. Un colchn duro puede brindarle ms apoyo para la Merchandiser, retail. Instrucciones generales  No use zapatos con tacones altos.  Consumir una dieta saludable. Trate de aumentar de peso dentro de las recomendaciones del mdico.  Use una faja de maternidad, un arns elstico o un cors para la espalda como se lo haya indicado el mdico.  Tome los medicamentos de venta libre y los recetados solamente como se lo haya indicado el mdico.  Oceanographer a todas las visitas de control como se lo haya indicado el mdico. Esto es importante. Incluye las visitas a los especialistas, como un fisioterapeuta. SOLICITE ATENCIN MDICA SI:  El dolor de espalda le impide realizar las actividades cotidianas.  Aumenta el dolor en otras partes del cuerpo. SOLICITE ATENCIN MDICA DE INMEDIATO SI:  Siente entumecimiento, hormigueo, debilidad o problemas con el uso de los brazos o las piernas.  Siente un dolor de espalda intenso que no puede controlar con los medicamentos.  Tiene modificaciones repentinas en el control de la vejiga o el intestino.  Siente que le falta el aire, se marea o se desmaya.  Tiene nuseas, vmitos o sudoracin.  Siente un dolor de espalda que es rtmico y de tipo clico, similar a las contracciones del [place]. Las contracciones del parto suelen aparecer cada 1 a , duran aproximadamente y estn acompaadas de una sensacin de empujar o de presin en la pelvis.  Tiene dolor de espalda y rompe la bolsa de las aguas o tiene sangrado vaginal.  El dolor o el adormecimiento se extienden hacia la pierna.  El dolor aparece despus de una cada.  Siente dolor de un solo lado.  Observa sangre en la orina.  Le aparecen ampollas en la piel en la zona del dolor de espalda. Esta informacin no tiene Theme park manager el consejo del mdico. Asegrese de hacerle al mdico cualquier pregunta que tenga. Document Released: 12/22/2010  Document Revised: 08/03/2015 Document Reviewed: 12/24/2014 Elsevier Interactive Patient Education  2018 [employer]. Systems analyst trimestre de Psychiatrist (Third Trimester of Pregnancy) El tercer trimestre comprende desde la semana29 hasta la semana42, es decir, desde el mes7 hasta el [address] En este trimestre, el feto crece muy rpido. Hacia el final del noveno mes, el feto mide alrededor de 20pulgadas (45cm) de largo y pesa entre 6y 10libras [phone]). CUIDADOS EN EL HOGAR  No fume, no consuma hierbas ni beba alcohol. No tome frmacos que el mdico no haya autorizado.  No consuma ningn producto que contenga tabaco, lo que incluye cigarrillos, tabaco de Theatre manager o Administrator, Civil Service. Si necesita ayuda para dejar de fumar, consulte al [employer]. Puede recibir asesoramiento u otro tipo de apoyo para dejar de fumar.  Tome los medicamentos solamente como se lo haya indicado el mdico. Algunos medicamentos son seguros para tomar durante el Psychiatrist y otros no lo son.  Haga ejercicios solamente como se lo haya indicado el mdico. Interrumpa la actividad fsica si comienza a tener calambres.  Ingiera alimentos saludables de [place] regular.  Use un sostn que  le brinde buen soporte si sus mamas estn sensibles.  No se d baos de inmersin en agua caliente, baos turcos ni saunas.  Colquese el cinturn de seguridad cuando conduzca.  No coma carne cruda ni queso sin cocinar; evite el contacto con las bandejas sanitarias de los gatos y la tierra que estos animales usan.  Tome las vitaminas prenatales.  Tome entre 1500 y 2000mg  de calcio diariamente comenzando en la semana20 del embarazo [place].  Pruebe tomar un medicamento que la ayude a defecar (un laxante suave) si el mdico lo autoriza. Consuma ms fibra, que se encuentra en las frutas y verduras frescas y los cereales integrales. Beba suficiente lquido para mantener el pis (orina) claro o de color amarillo  plido.  Dese baos de asiento con agua tibia para Engineer, materials o las molestias causadas por las hemorroides. Use una crema para las hemorroides si el mdico la autoriza.  Si se le hinchan las venas (venas varicosas), use medias de descanso. Levante (eleve) los pies durante , 3 o 4veces por Futures trader. Limite el consumo de sal en su dieta.  No levante objetos pesados, use zapatos de tacones bajos y sintese derecha.  Descanse con las piernas elevadas si tiene calambres o dolor de cintura.  Visite a su dentista si no lo ha Occupational hygienist. Use un cepillo de cerdas suaves para cepillarse los dientes. Psese el hilo dental con suavidad.  Puede seguir [employer], a menos que el mdico le indique lo contrario.  No haga viajes de larga distancia, excepto si es obligatorio y solamente con la aprobacin del mdico.  Tome clases prenatales.  Practique ir manejando al hospital.  Prepare el bolso que llevar al hospital.  Prepare la habitacin del beb.  Concurra a los controles mdicos.  SOLICITE AYUDA SI:  No est segura de si est en trabajo de parto o si ha roto la bolsa de las aguas.  Tiene mareos.  Siente calambres leves o presin en la parte inferior del abdomen.  Sufre un dolor persistente en el abdomen.  Tiene Programme researcher, broadcasting/film/video (nuseas), vmitos, o tiene deposiciones acuosas (diarrea).  Advierte un olor ftido que proviene de la vagina.  Siente dolor al [employer].  SOLICITE AYUDA DE INMEDIATO SI:  Tiene fiebre.  Tiene una prdida de lquido por la vagina.  Tiene sangrado o pequeas prdidas vaginales.  Siente dolor intenso o clicos en el abdomen.  Sube o baja de peso rpidamente.  Tiene dificultades para recuperar el aliento y siente dolor en el pecho.  Sbitamente se le hinchan mucho el rostro, las [place], los tobillos, los pies o las piernas.  No ha sentido los movimientos del beb durante Georgianne Fick.  Siente un dolor de  cabeza intenso que no se alivia con medicamentos.  Su visin se modifica.  Esta informacin no tiene Theme park manager el consejo del mdico. Asegrese de hacerle al mdico cualquier pregunta que tenga. Document Released: 12/12/2012 Document Revised: 05/02/2014 Document Reviewed: 06/12/2012 Elsevier Interactive Patient Education  2017 [employer].

## 2017-10-24 NOTE — Progress Notes (Signed)
Pt is here for an ROB visit. 

## 2017-10-25 LAB — CBC
HEMOGLOBIN: 11.3 g/dL (ref 11.1–15.9)
Hematocrit: 34.3 % (ref 34.0–46.6)
MCH: 29.8 pg (ref 26.6–33.0)
MCHC: 32.9 g/dL (ref 31.5–35.7)
MCV: 91 fL (ref 79–97)
Platelets: 198 10*3/uL (ref 150–450)
RBC: 3.79 x10E6/uL (ref 3.77–5.28)
RDW: 13.7 % (ref 12.3–15.4)
WBC: 8.1 10*3/uL (ref 3.4–10.8)

## 2017-10-25 LAB — RPR: RPR Ser Ql: NONREACTIVE

## 2017-10-25 LAB — GLUCOSE, 1 HOUR GESTATIONAL: Gestational Diabetes Screen: 171 mg/dL — ABNORMAL HIGH (ref 65–139)

## 2017-10-30 ENCOUNTER — Ambulatory Visit (INDEPENDENT_AMBULATORY_CARE_PROVIDER_SITE_OTHER): Payer: BLUE CROSS/BLUE SHIELD | Admitting: Certified Nurse Midwife

## 2017-10-30 VITALS — BP 91/60 | HR 97 | Ht 60.0 in | Wt 164.6 lb

## 2017-10-30 DIAGNOSIS — O09213 Supervision of pregnancy with history of pre-term labor, third trimester: Secondary | ICD-10-CM

## 2017-10-30 DIAGNOSIS — Z3A28 28 weeks gestation of pregnancy: Secondary | ICD-10-CM | POA: Diagnosis not present

## 2017-10-30 DIAGNOSIS — O09219 Supervision of pregnancy with history of pre-term labor, unspecified trimester: Principal | ICD-10-CM

## 2017-10-30 DIAGNOSIS — O09899 Supervision of other high risk pregnancies, unspecified trimester: Secondary | ICD-10-CM

## 2017-10-30 MED ORDER — HYDROXYPROGESTERONE CAPROATE 250 MG/ML IM OIL
250.0000 mg | TOPICAL_OIL | Freq: Once | INTRAMUSCULAR | Status: AC
  Administered 2017-12-07: 250 mg via INTRAMUSCULAR

## 2017-10-30 NOTE — Progress Notes (Signed)
Pt is present today for 17 P injection. Injection given. Pt has no side effects of the medication.

## 2017-10-30 NOTE — Progress Notes (Signed)
Please contact patient. Glucola elevated. Needs three (3) GTT ASAP. Other labs normal. Thanks, JML

## 2017-11-01 ENCOUNTER — Telehealth: Payer: Self-pay

## 2017-11-01 NOTE — Telephone Encounter (Signed)
[facility]- pt had a scheduled appt for 11/06/17 at 4pm. Explained MLs request for pt to have a 3 hour glucose test due to abnormal 1 hour. Pt will come to office at 8AM to start test.Request made for interpreter to be here with pt for 90 minutes. Pt also needs a 17P inj. Pt will see AT after start of test for 2 week ROB.

## 2017-11-06 ENCOUNTER — Ambulatory Visit: Payer: BLUE CROSS/BLUE SHIELD | Admitting: Certified Nurse Midwife

## 2017-11-06 ENCOUNTER — Other Ambulatory Visit: Payer: BLUE CROSS/BLUE SHIELD

## 2017-11-06 ENCOUNTER — Encounter: Payer: Self-pay | Admitting: Certified Nurse Midwife

## 2017-11-06 VITALS — BP 96/61 | HR 101 | Wt 165.8 lb

## 2017-11-06 DIAGNOSIS — Z3483 Encounter for supervision of other normal pregnancy, third trimester: Secondary | ICD-10-CM

## 2017-11-06 DIAGNOSIS — Z3402 Encounter for supervision of normal first pregnancy, second trimester: Secondary | ICD-10-CM

## 2017-11-06 DIAGNOSIS — O09899 Supervision of other high risk pregnancies, unspecified trimester: Secondary | ICD-10-CM

## 2017-11-06 DIAGNOSIS — O09213 Supervision of pregnancy with history of pre-term labor, third trimester: Secondary | ICD-10-CM | POA: Diagnosis not present

## 2017-11-06 DIAGNOSIS — Z3A30 30 weeks gestation of pregnancy: Secondary | ICD-10-CM

## 2017-11-06 DIAGNOSIS — O09219 Supervision of pregnancy with history of pre-term labor, unspecified trimester: Secondary | ICD-10-CM

## 2017-11-06 LAB — POCT URINALYSIS DIPSTICK
BILIRUBIN UA: NEGATIVE
Glucose, UA: NEGATIVE
KETONES UA: NEGATIVE
Nitrite, UA: NEGATIVE
Odor: NEGATIVE
Protein, UA: NEGATIVE
RBC UA: NEGATIVE
UROBILINOGEN UA: 0.2 U/dL
pH, UA: 8 (ref 5.0–8.0)

## 2017-11-06 MED ORDER — FLUCONAZOLE 150 MG PO TABS: 150.0000 mg | ORAL_TABLET | Freq: Once | ORAL | 1 refills | Status: AC

## 2017-11-06 MED ORDER — HYDROXYPROGESTERONE CAPROATE 275 MG/1.1ML ~~LOC~~ SOAJ
275.0000 mg | Freq: Once | SUBCUTANEOUS | Status: AC
  Administered 2017-11-06: 275 mg via SUBCUTANEOUS

## 2017-11-06 MED ORDER — METRONIDAZOLE 500 MG PO TABS: 500.0000 mg | ORAL_TABLET | Freq: Two times a day (BID) | ORAL | 0 refills | Status: AC

## 2017-11-06 MED ORDER — HYDROXYPROGESTERONE CAPROATE 250 MG/ML IM OIL
250.0000 mg | TOPICAL_OIL | Freq: Once | INTRAMUSCULAR | Status: AC
  Administered 2017-11-06: 250 mg via INTRAMUSCULAR

## 2017-11-06 NOTE — Progress Notes (Signed)
ROB doing well. 3 hr Glucola today. Pt complains of increased white discharge that is itching and painful.Wet prep today positive KOH, and clues , with yeast present. Order placed for Diflucan and flagyl with instructions reviewed. Will follow up with results of Glucola. Feels good fetal movement. ROB in 2 wks.   Telephone interpretor used for visit.   Doreene BurkeAnnie Zissel Biederman, CNM

## 2017-11-06 NOTE — Progress Notes (Signed)
ROB, 3hr gtt, 17 P- pt has a lot of pressure. Stomach feels full. Comes and goes. White vaginal d/c. Foul odor. Pos for vaginal itching.

## 2017-11-06 NOTE — Patient Instructions (Signed)
Vaginosis bacteriana (Bacterial Vaginosis) La vaginosis bacteriana es una infeccin de la vagina. Se produce cuando crece una cantidad excesiva de grmenes normales (bacterias sanas) en la vagina. Esta infeccin aumenta el riesgo de contraer otras infecciones de transmisin sexual. El tratamiento de esta infeccin puede ayudar a reducir el riesgo de otras infecciones, como:  Clamidia.  Bettey MareGonorrea.  VIH.  Herpes. CUIDADOS EN EL HOGAR  Tome los medicamentos tal como se lo indic su mdico.  Finalice la prescripcin completa, aunque comience a sentirse mejor.  Comunique a sus compaeros sexuales que sufre una infeccin. Deben consultar a su mdico para iniciar un tratamiento.  Durante el tratamiento: ? Soil scientistvite mantener relaciones sexuales o use preservativos de Network engineerla forma correcta. ? No se haga duchas vaginales. ? No consuma alcohol a menos que el mdico lo autorice. ? No amamante a menos que el mdico la autorice.  SOLICITE AYUDA SI:  No mejora luego de 3 das de [place] Janettratamiento.  Observa una secrecin (prdida) de color gris ms abundante que proviene de la vagina.  Siente ms dolor que antes.  Tiene fiebre.  ASEGRESE DE QUE:  Comprende estas instrucciones.  Controlar su afeccin.  Recibir ayuda de inmediato si no mejora o si empeora.  Esta informacin no tiene Theme park managercomo fin reemplazar el consejo del mdico. Asegrese de hacerle al mdico cualquier pregunta que tenga. Document Released: 07/08/2008 Document Revised: 08/03/2015 Document Reviewed: 11/21/2012 Elsevier Interactive Patient Education  2017 Elsevier Inc. Vaginal Yeast infection, Adult Vaginal yeast infection is a condition that causes soreness, swelling, and redness (inflammation) of the vagina. It also causes vaginal discharge. This is a common condition. Some women get this infection frequently. What are the causes? This condition is caused by a change in the normal balance of the yeast (candida) and bacteria that live  in the vagina. This change causes an overgrowth of yeast, which causes the inflammation. What increases the risk? This condition is more likely to develop in:  Women who take antibiotic medicines.  Women who have diabetes.  Women who take birth control pills.  Women who are pregnant.  Women who douche often.  Women who have a weak defense (immune) system.  Women who have been taking steroid medicines for a long time.  Women who frequently wear tight clothing.  What are the signs or symptoms? Symptoms of this condition include:  White, thick vaginal discharge.  Swelling, itching, redness, and irritation of the vagina. The lips of the vagina (vulva) may be affected as well.  Pain or a burning feeling while urinating.  Pain during sex.  How is this diagnosed? This condition is diagnosed with a medical history and physical exam. This will include a pelvic exam. Your health care provider will examine a sample of your vaginal discharge under a microscope. Your health care provider may send this sample for testing to confirm the diagnosis. How is this treated? This condition is treated with medicine. Medicines may be over-the-counter or prescription. You may be told to use one or more of the following:  Medicine that is taken orally.  Medicine that is applied as a cream.  Medicine that is inserted directly into the vagina (suppository).  Follow these instructions at home:  Take or apply over-the-counter and prescription medicines only as told by your health care provider.  Do not have sex until your health care provider has approved. Tell your sex partner that you have a yeast infection. That person should go to his or her health care provider if he or  she develops symptoms.  Do not wear tight clothes, such as pantyhose or tight pants.  Avoid using tampons until your health care provider approves.  Eat more yogurt. This may help to keep your yeast infection from  returning.  Try taking a sitz bath to help with discomfort. This is a warm water bath that is taken while you are sitting down. The water should only come up to your hips and should cover your buttocks. Do this 3-4 times per day or as told by your health care provider.  Do not douche.  Wear breathable, cotton underwear.  If you have diabetes, keep your blood sugar levels under control. Contact a health care provider if:  You have a fever.  Your symptoms go away and then return.  Your symptoms do not get better with treatment.  Your symptoms get worse.  You have new symptoms.  You develop blisters in or around your vagina.  You have blood coming from your vagina and it is not your menstrual period.  You develop pain in your abdomen. This information is not intended to replace advice given to you by your health care provider. Make sure you discuss any questions you have with your health care provider. Document Released: 01/19/2005 Document Revised: 09/23/2015 Document Reviewed: 10/13/2014 Elsevier Interactive Patient Education  2018 [employer].

## 2017-11-07 ENCOUNTER — Telehealth: Payer: Self-pay

## 2017-11-07 LAB — GESTATIONAL GLUCOSE TOLERANCE
GLUCOSE 1 HOUR GTT: 133 mg/dL (ref 65–179)
GLUCOSE FASTING: 80 mg/dL (ref 65–94)
Glucose, GTT - 2 Hour: 95 mg/dL (ref 65–154)
Glucose, GTT - 3 Hour: 125 mg/dL (ref 65–139)

## 2017-11-07 NOTE — Telephone Encounter (Signed)
-----   Message from Gunnar BullaJenkins Michelle Lawhorn, CNM sent at 11/07/2017  3:07 PM EDT ----- Please contact patient. GGT normal. No signs of gestational diabetes. Thanks, JML

## 2017-11-07 NOTE — Telephone Encounter (Signed)
Informed pt of neg test results per ML.

## 2017-11-13 ENCOUNTER — Ambulatory Visit (INDEPENDENT_AMBULATORY_CARE_PROVIDER_SITE_OTHER): Payer: BLUE CROSS/BLUE SHIELD | Admitting: Certified Nurse Midwife

## 2017-11-13 ENCOUNTER — Ambulatory Visit: Payer: BLUE CROSS/BLUE SHIELD

## 2017-11-13 VITALS — BP 100/63 | HR 114 | Ht 60.0 in | Wt 169.3 lb

## 2017-11-13 DIAGNOSIS — O09213 Supervision of pregnancy with history of pre-term labor, third trimester: Secondary | ICD-10-CM

## 2017-11-13 DIAGNOSIS — Z3A30 30 weeks gestation of pregnancy: Secondary | ICD-10-CM | POA: Diagnosis not present

## 2017-11-13 DIAGNOSIS — Z3483 Encounter for supervision of other normal pregnancy, third trimester: Secondary | ICD-10-CM

## 2017-11-13 MED ORDER — HYDROXYPROGESTERONE CAPROATE 250 MG/ML IM OIL
250.0000 mg | TOPICAL_OIL | Freq: Once | INTRAMUSCULAR | Status: AC
  Administered 2017-11-13: 250 mg via INTRAMUSCULAR

## 2017-11-13 NOTE — Progress Notes (Signed)
Pt is present today for 17P injection. Pt stated not having any side effects from the medication.   BP 100/63   Pulse (!) 114   Ht 5' (1.524 m)   Wt 169 lb 4.8 oz (76.8 kg)   LMP 04/16/2017 (Approximate)   BMI 33.06 kg/m

## 2017-11-21 ENCOUNTER — Ambulatory Visit (INDEPENDENT_AMBULATORY_CARE_PROVIDER_SITE_OTHER): Payer: BLUE CROSS/BLUE SHIELD | Admitting: Obstetrics and Gynecology

## 2017-11-21 VITALS — BP 113/68 | HR 107 | Wt 170.0 lb

## 2017-11-21 DIAGNOSIS — Z3493 Encounter for supervision of normal pregnancy, unspecified, third trimester: Secondary | ICD-10-CM

## 2017-11-21 LAB — POCT URINALYSIS DIPSTICK
BILIRUBIN UA: NEGATIVE
Blood, UA: NEGATIVE
GLUCOSE UA: POSITIVE — AB
Ketones, UA: 5
Nitrite, UA: NEGATIVE
Protein, UA: NEGATIVE
Spec Grav, UA: 1.015 (ref 1.010–1.025)
UROBILINOGEN UA: 0.2 U/dL
pH, UA: 6.5 (ref 5.0–8.0)

## 2017-11-21 MED ORDER — HYDROXYPROGESTERONE CAPROATE 275 MG/1.1ML ~~LOC~~ SOAJ
275.0000 mg | Freq: Once | SUBCUTANEOUS | Status: AC
  Administered 2017-11-21: 275 mg via SUBCUTANEOUS

## 2017-11-21 NOTE — Addendum Note (Signed)
Addended by: Brooke DareSICK, Seleen Walter L on: 11/21/2017 04:43 PM   Modules accepted: Orders

## 2017-11-21 NOTE — Patient Instructions (Signed)
Prevencin del parto prematuro Preventing Preterm Birth Se conoce como parto prematuro al nacimiento del beb entre las semanas 20y37del embarazo. Un embarazo a trmino comprende un mnimo de 37semanas. El parto prematuro puede ser peligroso para el beb, ya que las ltimas semanas de embarazo representan un tiempo importante en el que el cerebro y los pulmones del beb se desarrollan. Muchas cosas pueden hacer que el beb nazca antes de tiempo. Algunas veces la causa no se conoce. Hay ciertos factores que la hacen ms propensa a tener un parto prematuro, por ejemplo:  Haber tenido un parto prematuro antes.  Estar embarazada de ms de un beb.  Haberse sometido a un tratamiento de fertilidad.  Tener sobrepeso o muy bajo peso al comienzo del embarazo.  Haber experimentado algo de lo siguiente durante el embarazo: ? Una infeccin, incluida una infeccin de las vas urinarias o una infeccin de transmisin sexual (ITS). ? Hipertensin arterial. ? Diabetes. ? Hemorragia vaginal.  Tener 35aos o ms.  Tener 18aos o menos.  Quedar embarazada en el transcurso de los 6 meses posteriores a otro embarazo.  Sufrir mucho estrs, o maltrato fsico o emocional durante el embarazo.  Estar parada durante perodos muy prolongados durante el embarazo, como trabajar en un puesto que lo requiera.  Cules son los riesgos? El riesgo ms grave del parto prematuro es que el beb no sobreviva. Es ms probable que esto ocurra si un beb nace antes de las 34semanas. Otros riesgos y complicaciones del parto prematuro podran incluir que el beb tuviera algo de lo siguiente:  Problemas respiratorios.  Dao cerebral que afecta el movimiento y la coordinacin(parlisis cerebral).  Dificultades para alimentarse.  Problemas de visin o audicin.  Infecciones o inflamacin del tubo digestivo(colitis).  Retrasos en el desarrollo.  Dificultades de aprendizaje.  Mayor riesgo de padecer diabetes,  enfermedades cardacas y presin arterial alta en el futuro.  Qu puedo hacer para disminuir el riesgo? Atencin mdica  Lo ms importante que puede hacer para disminuir el riesgo de tener un parto prematuro es obtener atencin mdica de rutina durante el embarazo (cuidado prenatal). Si el riesgo de tener un parto prematuro es alto, la podran derivar a un mdico que se especialice en el control de embarazos de riesgo (perinatlogo). Tal vez le receten medicamentos para ayudar a prevenir el parto prematuro. Cambios en el estilo de vida Ciertos cambios en el estilo de vida tambin pueden disminuir el riesgo de tener un parto prematuro:  Espere al menos 6 meses despus de un embarazo para volver a quedar embarazada.  En la medida de lo posible, planifique quedar embarazada entre los 19y los35aos.  Antes de quedar embarazada, logre un peso saludable. Si tiene sobrepeso, trabaje con el mdico para adelgazar sin riegos.  No consuma ningn producto que contenga nicotina o tabaco, como cigarrillos y cigarrillos electrnicos. Si necesita ayuda para dejar de fumar, consulte al mdico.  No beba alcohol.  No consuma drogas.  Dnde encontrar apoyo: Para obtener ms ayuda, considere lo siguiente:  Hablar con el mdico.  Hablar con un terapeuta o un asesor de consumo de drogas si necesita ayuda para dejar de hacerlo.  Trabajar con un especialista en alimentacin y nutricin (nutricionista) o un entrenador fsico para mantener un peso saludable.  Unirse a un grupo de apoyo.  Dnde encontrar ms informacin: Obtenga ms informacin sobre cmo prevenir un embarazo prematuro en los siguientes sitios:  Centros para el control y la prevencin de enfermedades(Centers for Disease Control and Prevention, CDC): cdc.gov/reproductivehealth/maternalinfanthealth/pretermbirth.htm    March of Dimes: marchofdimes.org/complications/premature-babies.aspx  Asociacin Americana del Embarazo (American  Pregnancy Association): americanpregnancy.org/labor-and-birth/premature-labor  Comunquese con un mdico si:  Tiene cualquiera de estos signos de trabajo de parto prematuro antes de las 37semanas: ? Cambio o aumento de la secrecin vaginal. ? Prdida de lquido por la vagina. ? Presin o calambres en la parte inferior del abdomen. ? Dolor de espalda que no se calma o empeora. ? Endurecimiento regular (contracciones) en la parte inferior del abdomen. Resumen  Parto prematuro significa tener al beb durante las semanas 20a37 del embarazo.  El parto prematuro podra poner en riesgo de padecer problemas fsicos y mentales al beb.  Obtener un buen cuidado prenatal puede ayudar a prevenir el parto prematuro.  Puede disminuir el riesgo de tener un embarazo prematuro realizando ciertos cambios en el estilo de vida. Esta informacin no tiene como fin reemplazar el consejo del mdico. Asegrese de hacerle al mdico cualquier pregunta que tenga. Document Released: 05/26/2015 Document Revised: 08/19/2016 Document Reviewed: 05/26/2015 Elsevier Interactive Patient Education  2018 Elsevier Inc.  

## 2017-11-21 NOTE — Progress Notes (Signed)
ROB- doing well, 17-ohp;  Reports increased contractions after working starting on Thursday. Had to lay down to get them to stop. Every 20-30 minutes. FFN obtained. PTL precautions discussed.

## 2017-11-21 NOTE — Progress Notes (Signed)
ROB- pt is having some pelvic pressure 

## 2017-11-22 LAB — FETAL FIBRONECTIN: Fetal Fibronectin: NEGATIVE

## 2017-11-30 ENCOUNTER — Ambulatory Visit (INDEPENDENT_AMBULATORY_CARE_PROVIDER_SITE_OTHER): Payer: BLUE CROSS/BLUE SHIELD | Admitting: Certified Nurse Midwife

## 2017-11-30 ENCOUNTER — Encounter: Payer: Self-pay | Admitting: Certified Nurse Midwife

## 2017-11-30 VITALS — BP 111/75 | HR 112 | Wt 172.1 lb

## 2017-11-30 DIAGNOSIS — Z3493 Encounter for supervision of normal pregnancy, unspecified, third trimester: Secondary | ICD-10-CM | POA: Diagnosis not present

## 2017-11-30 DIAGNOSIS — O09219 Supervision of pregnancy with history of pre-term labor, unspecified trimester: Secondary | ICD-10-CM | POA: Diagnosis not present

## 2017-11-30 DIAGNOSIS — O09899 Supervision of other high risk pregnancies, unspecified trimester: Secondary | ICD-10-CM

## 2017-11-30 LAB — POCT URINALYSIS DIPSTICK
BILIRUBIN UA: NEGATIVE
Blood, UA: NEGATIVE
GLUCOSE UA: NEGATIVE
KETONES UA: NEGATIVE
LEUKOCYTES UA: NEGATIVE
Nitrite, UA: NEGATIVE
Protein, UA: NEGATIVE
SPEC GRAV UA: 1.015 (ref 1.010–1.025)
Urobilinogen, UA: 0.2 E.U./dL
pH, UA: 6.5 (ref 5.0–8.0)

## 2017-11-30 MED ORDER — HYDROXYPROGESTERONE CAPROATE 275 MG/1.1ML ~~LOC~~ SOAJ
275.0000 mg | Freq: Once | SUBCUTANEOUS | Status: AC
  Administered 2017-11-30: 275 mg via SUBCUTANEOUS

## 2017-11-30 NOTE — Patient Instructions (Signed)
Evaluacin de los movimientos fetales  Fetal Movement Counts  Introduccin  Nombre del paciente: ________________________________________________ Fecha de parto estimada: ____________________  Qu es una evaluacin de los movimientos fetales?  Una evaluacin de los movimientos fetales es el registro del nmero de veces que siente que el beb se mueve durante un cierto perodo de tiempo. Esto tambin se puede denominar recuento de patadas fetales. Una evaluacin de movimientos fetales se recomienda a todas las embarazadas. Es posible que le indiquen que comience a evaluar los movimientos fetales desde la semana 28 de embarazo.  Preste atencin cuando sienta que el beb est ms activo. Podr detectar los ciclos en que el beb duerme y est despierto. Tambin podr detectar que ciertas cosas hacen que su beb se mueva ms. Deber realizar una evaluacin de los movimientos fetales en las siguientes situaciones:   Cuando el beb est ms activo habitualmente.   A la misma hora, todos los das.    Un buen momento para evaluar los movimientos fetales es cuando est descansando, despus de haber comido y bebido algo.  Cmo debo contar los movimientos fetales?  1. Encuentre un lugar tranquilo y cmodo. Sintese o acustese de lado.  2. Anote la fecha, la hora de inicio y de finalizacin y la cantidad de movimientos que sinti entre esas dos horas. Lleve esta informacin a las visitas de control.  3. Cuente las pataditas, revoloteos, chasquidos, vueltas o pinchazos en un perodo de 2horas. Debe sentir al menos 10movimientos en 2horas.  4. Cuando sienta 10movimientos, puede dejar de contar.  5. Si no siente 10movimientos en 2horas, coma y beba algo. Luego, contine descansando y contando durante 1hora. Si siente al menos 4movimientos durante esa hora, puede dejar de contar.  Comunquese con un mdico si:   Siente menos de 4movimientos en 2horas.   El beb no se mueve tanto como suele hacerlo.  Fecha:  ____________ Hora de inicio: ____________ Hora de finalizacin: ____________ Movimientos: ____________  Fecha: ____________ Hora de inicio: ____________ Hora de finalizacin: ____________ Movimientos: ____________  Fecha: ____________ Hora de inicio: ____________ Hora de finalizacin: ____________ Movimientos: ____________  Fecha: ____________ Hora de inicio: ____________ Hora de finalizacin: ____________ Movimientos: ____________  Fecha: ____________ Hora de inicio: ____________ Hora de finalizacin: ____________ Movimientos: ____________  Fecha: ____________ Hora de inicio: ____________ Hora de finalizacin: ____________ Movimientos: ____________  Fecha: ____________ Hora de inicio: ____________ Hora de finalizacin: ____________ Movimientos: ____________  Fecha: ____________ Hora de inicio: ____________ Hora de finalizacin: ____________ Movimientos: ____________  Fecha: ____________ Hora de inicio: ____________ Hora de finalizacin: ____________ Movimientos: ____________  Esta informacin no tiene como fin reemplazar el consejo del mdico. Asegrese de hacerle al mdico cualquier pregunta que tenga.  Document Released: 07/19/2007 Document Revised: 07/15/2016 Document Reviewed: 05/21/2015  Elsevier Interactive Patient Education  2018 Elsevier Inc.

## 2017-11-30 NOTE — Progress Notes (Signed)
ROB and 17 p inj. - back pain.

## 2017-12-02 NOTE — Progress Notes (Signed)
ROB-17p injection given. States she is feeling better with decreased work hours. Anticipatory guidance regarding course of prenatal care. Reviewed red flag symptoms and when to call. RTC x 1 week for 17p injection. RTC x 2 weeks for 17p injection and ROB or sooner if needed.

## 2017-12-07 ENCOUNTER — Ambulatory Visit (INDEPENDENT_AMBULATORY_CARE_PROVIDER_SITE_OTHER): Payer: BLUE CROSS/BLUE SHIELD | Admitting: Certified Nurse Midwife

## 2017-12-07 VITALS — BP 106/59 | HR 103 | Wt 174.6 lb

## 2017-12-07 DIAGNOSIS — O09213 Supervision of pregnancy with history of pre-term labor, third trimester: Secondary | ICD-10-CM | POA: Diagnosis not present

## 2017-12-07 DIAGNOSIS — Z3A34 34 weeks gestation of pregnancy: Secondary | ICD-10-CM | POA: Diagnosis not present

## 2017-12-07 DIAGNOSIS — Z3493 Encounter for supervision of normal pregnancy, unspecified, third trimester: Secondary | ICD-10-CM

## 2017-12-07 LAB — POCT URINALYSIS DIPSTICK
BILIRUBIN UA: NEGATIVE
Glucose, UA: NEGATIVE
KETONES UA: NEGATIVE
Leukocytes, UA: NEGATIVE
Nitrite, UA: NEGATIVE
PH UA: 6 (ref 5.0–8.0)
Protein, UA: POSITIVE — AB
RBC UA: NEGATIVE
Spec Grav, UA: 1.02 (ref 1.010–1.025)
UROBILINOGEN UA: 0.2 U/dL

## 2017-12-07 MED ORDER — HYDROXYPROGESTERONE CAPROATE 275 MG/1.1ML ~~LOC~~ SOAJ: 275.0000 mg | Freq: Once | SUBCUTANEOUS | Status: DC

## 2017-12-07 NOTE — Patient Instructions (Signed)
Hydroxyprogesterone solution for injection O que  este medicamento? A HIDROXIPROGESTERONA  um hormnio feminino. Este medicamento  usado em mulheres que esto grvidas e anteriormente j deram  luz um beb prematuramente (parto prematuro). Ajuda a diminuir o risco de ter outro beb prematuro. Este medicamento pode ser usado para outros propsitos; em caso de dvidas, pergunte ao seu profissional de sade ou farmacutico. NOMES DE MARCAS COMUNS: Makena O que devo dizer a meu profissional de sade antes de tomar este medicamento? Precisam saber se voc tem algum dos seguintes problemas ou estados de sade: -problemas de sangramento -cncer de mama, do colo do tero, do tero ou da vagina -depresso -diabetes ou pr-diabetes -doenas cardacas -presso alta -doenas renais -doenas hepticas -doenas pulmonares ou respiratrias (por exemplo, asma) -enxaqueca -convulses -sangramento vaginal -reao estranha ou alergia  hidroxiprogesterona, a outros hormnios, medicamentos, alimentos, corantes, ao leo de rcino, ao lcool benzlico ou a outros conservantes -est [employer] Incamamentando Como devo usar este medicamento? Este medicamento deve ser injetado no msculo. Este medicamento  administrado por um profissional da sade no hospital ou em consultrio. Voc provavelmente tomar uma injeo semanal para evitar um parto prematuro. Fale com seu pediatra a respeito do uso deste medicamento em crianas. Pode ser preciso tomar alguns cuidados especiais. Superdosagem: Se achar que tomou uma superdosagem deste medicamento, entre em contato imediatamente com o Centro de Centuryontrole de Intoxicaes ou v a Health Netum pronto-socorro. OBSERVAO: Este medicamento  s para voc. No compartilhe este medicamento com outras pessoas. E se eu deixar de tomar uma dose?  importante que voc no perca nenhuma dose. Se no puder comparecer a uma consulta, entre em contato com seu mdico ou profissional de sade. O que pode  interagir com este medicamento? -paracetamol (acetaminofeno) -bupropiona -clozapina -efavirenz -halotano -metadona -nicotina -teofilina, aminofilina -tizanidina Esta lista pode no descrever todas as interaes possveis. D ao seu profissional de sade uma lista de todos os medicamentos, ervas medicinais, remdios de venda livre, ou suplementos alimentares que voc [place]. Diga tambm se voc fuma, bebe, ou [place]a drogas ilcitas. Alguns destes podem interagir com o seu medicamento. Ao que devo ficar atento quando estiver Sunocousando este medicamento? Voc ser monitorado(a) atentamente enquanto estiver Consolidated Edisontomando este medicamento. Que efeitos colaterais posso sentir aps usar este medicamento? Efeitos colaterais que devem ser informados ao seu mdico ou profissional de sade o mais rpido possvel: -reaes alrgicas, como erupo na pele, coceira, urticria, ou inchao do rosto, dos lbios ou da lngua -dificuldade para respirar -alteraes nos tecidos mamrios ou secreo -alteraes na viso -confuso, dificuldade para falar ou entender os outros -deprimido -aumento da fome ou sede -necessidade de urinar com frequncia -dor, vermelhido, coceira ou irritao no local da injeo -dor, inchao, calor na perna -falta de ar, dor no peito, inchao em [id number] Grant Roaduma das pernas -dormncia ou fraqueza sbita do rosto, brao ou perna -dor de cabea forte e sbita -dificuldade para andar, tontura, perda de equilbrio ou coordenao -fraqueza ou cansao fora do comum -s[place]ramento vaginal -olhos ou pele amarelados Efeitos colaterais que normalmente no precisam de cuidados mdicos (avise ao seu mdico ou profissional de sade se persistirem ou forem incmodos): -alteraes emocionais ou de humor -diarreia -reteno de lquido e inchao -enjoo Esta lista pode no descrever todos os efeitos colaterais possveis. Para mais orientaes sobre efeitos colaterais, consulte o seu mdico. Voc pode relatar a ocorrncia  de efeitos colaterais  FDA pelo telefone [phone]. Onde devo guardar meu medicamento? Este medicamento  administrado por um profissional da sade no hospital ou em consultrio. Voc  no receber este medicamento para Countrywide Financialconservao em casa. NOTA: Este folheto  um resumo. Pode no cobrir todas as informaes possveis. Se tiver dvidas a respeito deste medicamento, fale com seu mdico, farmacutico ou profissional de sade. OBSERVAO: Este folheto  um resumo. Pode no cobrir todas as informaes possveis. Se tiver dvidas a respeito deste medicamento, fale com seu mdico, farmacutico ou profissional de sade.  2018 Elsevier/Gold Standard (2016-05-12 00:00:00)

## 2017-12-07 NOTE — Progress Notes (Signed)
Pt is here for a17 P. C/o pain in vaginal area.

## 2017-12-07 NOTE — Progress Notes (Signed)
ROB- C/O pelvic pressure, stomach discomfort after eating and diarrhea. Discussed the side effects of 17P. Encouraged to follow a bland diet and call if symptoms persist. Reviewed red flag symptoms and when to call. RTC x 1 week for 17P & ROB or sooner if needed.

## 2017-12-13 ENCOUNTER — Ambulatory Visit: Payer: BLUE CROSS/BLUE SHIELD | Admitting: Certified Nurse Midwife

## 2017-12-13 VITALS — BP 108/75 | HR 123 | Wt 172.3 lb

## 2017-12-13 DIAGNOSIS — Z3493 Encounter for supervision of normal pregnancy, unspecified, third trimester: Secondary | ICD-10-CM

## 2017-12-13 LAB — POCT URINALYSIS DIPSTICK OB
BILIRUBIN UA: NEGATIVE
LEUKOCYTES UA: NEGATIVE
Nitrite, UA: NEGATIVE
POC,PROTEIN,UA: NEGATIVE
RBC UA: NEGATIVE
SPEC GRAV UA: 1.02 (ref 1.010–1.025)
Urobilinogen, UA: 0.2 E.U./dL
pH, UA: 7 (ref 5.0–8.0)

## 2017-12-13 MED ORDER — HYDROXYPROGESTERONE CAPROATE 275 MG/1.1ML ~~LOC~~ SOAJ
275.0000 mg | Freq: Once | SUBCUTANEOUS | Status: AC
  Administered 2017-12-13: 275 mg via SUBCUTANEOUS

## 2017-12-13 NOTE — Patient Instructions (Signed)
Infeccin por estreptococo del grupoB durante el embarazo (Group B Streptococcus Infection During Pregnancy) El estreptococo del grupoB es un tipo de bacteria (EGB) (Streptococcus agalactiae) que suele encontrarse en personas saludables, comnmente en el recto, la vagina y los intestinos. En personas saludables y en mujeres no embarazadas, rara vez la bacteria provoca una enfermedad o complicaciones graves. Sin embargo, las mujeres [place]uya prueba de EGB es positiva durante el embarazo pueden transmitirle la bacteria al beb en el parto; esto puede provocar una infeccin grave en el beb despus del parto. Las mujeres con EGB tambin pueden tener infecciones durante el Psychiatristembarazo o inmediatamente despus del Wallsparto, como infecciones de las vas urinarias (IVU) o infecciones del tero (infecciones uterinas). Tener EGB tambin H&R Blockaumenta el riesgo de complicaciones durante el Hopeembarazo, como parto o trabajo de parto prematuros, aborto espontneo o muerte fetal. Se recomienda que todas las embarazadas se hagan pruebas (exmenes de deteccin) de rutina para determinar la presencia del estreptococo del grupoB. FACTORES DE RIESGO Puede tener un mayor riesgo de contraer una infeccin por estreptococo del grupoB durante el embarazo si ya le ocurri en un embarazo previo. SNTOMAS En la International Business Machinesmayora de los casos, la infeccin por EGB no causa sntomas en las Reasnorembarazadas. Los signos y sntomas de una posible infeccin relacionada con el EGB pueden incluir los siguientes:  Inicio del Stilestrabajo de parto antes de la semana37 de gestacin.  Una infeccin de las vas urinarias o de la vejiga, que puede provocar: ? Grant RutsFiebre. ? Sensacin de dolor o ardor al ConocoPhillipsorinar. ? Ganas frecuentes de orinar.  Fiebre durante el Winnebagotrabajo de Galtparto, junto con: ? Una secrecin con mal olor. ? Dolor a Public affairs consultantla palpacin en el tero. ? Frecuencia cardaca acelerada en la madre, en el beb o en ambos. Los sntomas poco frecuentes pero graves de una posible  infeccin relacionada con el EGB en las mujeres incluyen:  Infeccin en la sangre (septicemia). Esto puede provocar fiebre, escalofros o confusin.  Infeccin pulmonar (neumona). Esto puede provocar fiebre, escalofros, tos, respiracin rpida, dificultad para respirar o Journalist, newspaperdolor en el pecho.  Infeccin en los huesos, las articulaciones, la piel o los tejidos blandos. DIAGNSTICO Le pueden realizar exmenes para detectar el EGB entre la semana35 y la semana37 de gestacin. Si tiene sntomas de trabajo de Sport and exercise psychologistparto prematuro, Fish farm managerle pueden realizar los exmenes de deteccin antes. Esta afeccin se diagnostica a travs de los Ronnebyresultados de Jacksonhavenanlisis de laboratorio de:  Un hisopo con lquido de la vagina y Administratorel recto.  Lauris PoagUna muestra de Comorosorina. TRATAMIENTO Esta enfermedad se trata con antibiticos. Al comenzar el trabajo de parto o apenas rompe bolsa (ruptura de las Luthervillemembranas), le administrarn antibiticos a travs de una va intravenosa (IV). Seguir con antibiticos hasta despus del parto. Si tiene un parto por cesrea, no necesita antibiticos salvo que las membranas ya se hayan roto. INSTRUCCIONES PARA EL CUIDADO EN EL HOGAR  Tome los medicamentos de venta libre y los recetados solamente como se lo haya indicado el mdico.  Tome los antibiticos como se lo haya indicado el mdico. No deje de tomar los antibiticos aunque comience a sentirse mejor.  Concurra a todas las visitas previas al parto (prenatales) y las visitas de control como se lo haya indicado el mdico. Esto es importante. SOLICITE ATENCIN MDICA SI:  Siente dolor o ardor al Geographical information systems officerorinar.  Tiene necesidad de orinar con frecuencia.  Tiene fiebre o siente escalofros.  Tiene una secrecin vaginal con mal olor. SOLICITE ATENCIN MDICA DE INMEDIATO SI:  Se produce  la ruptura de las [place]villemembranas.  Comienza el trabajo de Little Riverparto.  Siente un dolor intenso en el abdomen.  Tiene dificultad para respirar.  Siente dolor en el pecho. Esta  informacin no tiene Theme park managercomo fin reemplazar el consejo del mdico. Asegrese de hacerle al mdico cualquier pregunta que tenga. Document Released: 09/28/2007 Document Revised: 04/16/2013 Document Reviewed: 11/05/2015 Elsevier Interactive Patient Education  [employer]2018 Elsevier Inc.

## 2017-12-13 NOTE — Progress Notes (Signed)
Pt is here for an OB visit. States she is having severe back pain. Also states her lower abdomen is very painful. C/o "leaking fluid".

## 2017-12-13 NOTE — Progress Notes (Signed)
ROB, pt state she is not feeling well. That she vomited x 2 today. Discussed possibility of virus. Encourage bland small meals and PO hydration. Red flags symptoms reviewed. Feels good fetal movement. Has back pain and low pelvic pressure. Encouraged use of belly band , tylenol and ice to her back . She verbalizes understanding and agrees. She denies leaking of water but states leaking of mucus. Anticipatory guidance for 36 wk cultures. Follow up 1 wk for 17 P injection. @ wks for ROB.   Doreene BurkeAnnie Drew Lips, CNM

## 2017-12-28 ENCOUNTER — Ambulatory Visit (INDEPENDENT_AMBULATORY_CARE_PROVIDER_SITE_OTHER): Payer: BLUE CROSS/BLUE SHIELD | Admitting: Certified Nurse Midwife

## 2017-12-28 VITALS — BP 124/79 | HR 97 | Wt 178.8 lb

## 2017-12-28 DIAGNOSIS — O09213 Supervision of pregnancy with history of pre-term labor, third trimester: Secondary | ICD-10-CM

## 2017-12-28 DIAGNOSIS — O09219 Supervision of pregnancy with history of pre-term labor, unspecified trimester: Secondary | ICD-10-CM

## 2017-12-28 DIAGNOSIS — Z3A36 36 weeks gestation of pregnancy: Secondary | ICD-10-CM | POA: Diagnosis not present

## 2017-12-28 DIAGNOSIS — O09899 Supervision of other high risk pregnancies, unspecified trimester: Secondary | ICD-10-CM

## 2017-12-28 DIAGNOSIS — Z3493 Encounter for supervision of normal pregnancy, unspecified, third trimester: Secondary | ICD-10-CM

## 2017-12-28 DIAGNOSIS — Z3685 Encounter for antenatal screening for Streptococcus B: Secondary | ICD-10-CM

## 2017-12-28 DIAGNOSIS — Z113 Encounter for screening for infections with a predominantly sexual mode of transmission: Secondary | ICD-10-CM

## 2017-12-28 LAB — POCT URINALYSIS DIPSTICK OB
Bilirubin, UA: NEGATIVE
Blood, UA: NEGATIVE
Glucose, UA: NEGATIVE
Ketones, UA: NEGATIVE
NITRITE UA: NEGATIVE
PROTEIN: NEGATIVE
Spec Grav, UA: 1.015 (ref 1.010–1.025)
Urobilinogen, UA: 0.2 E.U./dL
pH, UA: 6.5 (ref 5.0–8.0)

## 2017-12-28 MED ORDER — HYDROXYPROGESTERONE CAPROATE 275 MG/1.1ML ~~LOC~~ SOAJ
275.0000 mg | Freq: Once | SUBCUTANEOUS | Status: AC
  Administered 2017-12-28: 275 mg via SUBCUTANEOUS

## 2017-12-28 NOTE — Patient Instructions (Signed)
Parto vaginal Vaginal Delivery Parto vaginal significa que usted dar a luz empujando al beb fuera del canal del parto (vagina). Un equipo de proveedores de atencin mdica la ayudar antes, durante y despus del parto vaginal. Las experiencias de los nacimientos son nicas para todas las mujeres y cada embarazo y las experiencias de nacimiento varan segn dnde elija dar a luz. Qu debo hacer a fin de prepararme para el nacimiento de mi beb? Antes del nacimiento de su beb, es importante que hable con su mdico sobre lo siguiente:  Sus preferencias en cuanto al trabajo de parto y parto. Estas pueden incluir lo siguiente: ? Medicamentos que le puedan administrar. ? Cmo controlar el dolor. Esto podra incluir tcnicas de alivio del dolor no mdicas o medicamentos inyectables para aliviar el dolor como la analgesia epidural. ? Cmo se los controlar a usted y a su beb durante el trabajo de parto y el parto. ? Quin puede estar en la sala de trabajo de parto y parto con usted. ? Sus opiniones en cuanto al parto quirrgico de su beb (parto por cesrea o cesrea) si esto fuera necesario. ? Sus opiniones en cuanto a recibir sangre donada a travs de un tubo (catter) intravenoso (transfusin de sangre) si esto fuera necesario.  Si usted puede: ? Tomar fotografas o videos del nacimiento. ? Comer durante el trabajo de parto y el parto. ? Moverse, caminar o cambiar de posicin durante el trabajo de parto y el parto.  Qu esperar despus del nacimiento de su beb, como: ? Si se ofrece el pinzamiento y corte tardo del cordn umbilical. ? Quin cuidar a su beb inmediatamente despus del nacimiento. ? Medicamentos o pruebas que pueden recomendarse para su beb. ? Si su hospital o centro de parto apoya la lactancia. ? Cunto tiempo estar en el hospital o en el centro de parto.  Cmo cualquier afeccin mdica que usted tenga puede afectar a su beb o la experiencia de trabajo de parto y  parto.  A fin de prepararse para el nacimiento de su beb, usted tambin debe:  Asistir a todas sus visitas de atencin mdica antes del parto (visitas prenatales) segn lo recomendado por su mdico. Esto es importante.  Preparacin del hogar para la llegada de su beb. Asegrese de tener lo siguiente: ? Paales. ? Ropa de beb. ? Equipo de alimentacin. ? Haga preparativos para que usted y su beb puedan dormir de manera segura.  Instale un asiento de beb en su vehculo. Haga verificar el asiento de beb de su coche por un instalador de asientos de beb para asegurarse de que est instalado en forma segura.  Piense en quin la ayudar con su nuevo beb en el hogar durante al menos las primeras semanas despus del parto.  Qu puedo esperar cuando llegue al centro de parto o el hospital? Una vez que se inicie el trabajo de parto y haya sido admitida en el hospital o centro de parto, el mdico podr hacer lo siguiente:  Revisar sus antecedentes de embarazo y cualquier inquietud que usted pueda tener.  Colocar un tubo (catter) intravenoso en una de sus venas. Esto se usa para administrarle lquidos y medicamentos.  Verificar su presin arterial, temperatura y pulso y la frecuencia cardaca (signos vitales).  Verificar si la bolsa de agua (saco amnitico) se ha roto (ruptura).  Hablar con usted sobre su plan de nacimiento y analizar las opciones para controlar el dolor.  Monitoreo Su mdico puede monitorear las contracciones (monitoreo uterino) y   el ritmo cardaco del beb (monitoreo fetal). Es posible que el monitoreo se necesite realizar:  Con frecuencia, pero no continuamente (intermitentemente).  Todo el tiempo o durante largos perodos a la vez (continuamente). El monitoreo continuo puede ser necesario si: ? Usted est recibiendo determinados medicamentos, tales como medicamentos para aliviar el dolor o para hacer que las contracciones ms fuertes. ? Tiene complicaciones  durante el embarazo o el trabajo de parto.  El monitoreo se puede realizar:  Al colocar un estetoscopio especial o un dispositivo manual de monitoreo en el abdomen o verificar los latidos cardacos de su beb y sentir su abdomen para controlar de contracciones. Este mtodo de monitoreo no registra los latidos cardacos de su beb ni sus contracciones de manera continua.  Colocar monitores en el abdomen (monitores externos) para registrar los latidos cardacos de su beb y la frecuencia y duracin de las contracciones. No tendr que tener colocados los monitores externos en todo momento.  Colocar monitores dentro del tero (monitores internos) para registrar los latidos cardacos de su beb y la frecuencia, duracin y fuerza de sus contracciones. ? Su mdico podr usar monitores internos si necesita ms informacin sobre la fuerza de las contracciones o la frecuencia cardaca del beb. ? Los monitores internos se colocan pasando un cable delgado y flexible a travs de la vagina hasta el tero. Segn el tipo de monitor, puede permanecer en el tero o en la cabeza del beb hasta el nacimiento. ? Su mdico analizar con usted los beneficios y los riesgos de usar un monitor interno y le pedir permiso antes de colocar los monitores.  Telemetra. Se trata de un tipo de monitoreo continuo que se puede realizar con monitores externos o internos. En lugar de tener que permanecer en la cama, usted puede moverse durante la telemetra. Pregunte a su mdico si la telemetra es una opcin para usted.  Examen fsico. Su mdico puede realizarle un examen fsico. Esto puede incluir lo siguiente:  Verificar en qu posicin se encuentra su beb: ? Con la cabeza hacia la vagina (cabeza abajo). Esta es la posicin ms frecuente. ? Con la cabeza hacia la parte superior del tero (cabeza arriba o de nalgas). Si su beb est en una posicin de nalgas, su mdico puede tratar de hacerlo girar para que quede cabeza abajo a  fin de poder tener un parto vaginal. Si parece que su beb no puede nacer con parto vaginal, su mdico puede recomendar una ciruga para dar a luz al beb. En casos muy poco frecuentes, usted puede dar a luz con parto vaginal si el beb est cabeza arriba (parto de nalgas). ? Posicin lateral (transversal). Los bebs que estn en posicin lateral no pueden nacer por parto vaginal.  Verificar el cuello uterino para determinar: ? Si se est afinando o estirando (borrando). ? Si se est abriendo (dilatando). ? Cunto se ha movido o ha bajado su beb por el canal del parto.  Cules son las tres etapas del trabajo de parto y el parto?  El trabajo de parto y el parto normales se dividen en tres etapas: Etapa 1  La etapa 1 es la etapa ms larga del trabajo de parto y puede durar horas o das. La etapa 1 incluye: ? Trabajo de parto temprano. Esto es cuando las contracciones pueden ser irregulares o regulares y leves. En general, las contracciones del trabajo de parto temprano se producen con ms de 10 minutos de diferencia. ? Trabajo de parto activo. Esto es cuando las   contracciones son ms largas, ms regulares, ms frecuentes y ms intensas. ? La fase de transicin. Esto es cuando las contracciones ocurren muy seguidas, son muy intensas y pueden durar ms que durante cualquier otra parte del trabajo de parto.  En general, las contracciones son leves, infrecuentes e irregulares al principio. Se hacen ms fuertes, ms frecuentes (aproximadamente cada 2 o 3 minutos) y ms regulares a medida que usted avanza desde un trabajo de parto temprano hasta un trabajo de parto activo y fase de transicin.  Muchas mujeres progresan a travs de la etapa 1 de forma natural, pero es posible que usted necesite ayuda para continuar avanzando. Si esto ocurre, su mdico puede hablar con usted sobre lo siguiente: ? La ruptura del saco amnitico si es que an no ha ocurrido. ? Administracin de medicamentos para ayudarla  a tener contracciones ms fuertes y ms frecuentes.  La etapa 1 finaliza cuando el cuello uterino est completamente dilatado hasta 4 pulgadas (10cm) y completamente borrado. Esto ocurre al final de la fase de transicin. Etapa 2  Una vez que el cuello uterino est totalmente borrado y dilatado a 4 pulgadas (10cm), usted puede comenzar a sentir ganas de pujar. Es comn que el cuerpo tome un descanso de manera natural antes de sentir ganas de pujar, especialmente si recibe una epidural u otros medicamentos para el dolor. Este perodo de descanso puede durar un mximo de 1 a 2 horas, segn su experiencia de parto nica.  Durante la etapa 2, las contracciones son generalmente menos doloras porque pujar ayuda a aliviar el dolor de las contracciones. En lugar del dolor de las contracciones, puede sentir un dolor urente y por estiramiento, especialmente cuando la parte ms ancha de la cabeza de su beb pasa a travs de la abertura vaginal (coronacin).  Su mdico controlar atentamente su avance con los pujos y el avance del beb a travs de la vagina durante la etapa 2.  Su mdico puede masajear el rea de la piel entre la abertura vaginal y el ano (perineo) o aplicar compresas tibias en el perineo. Esto ayuda al estiramiento ya que la cabeza del beb empieza a aparecer, lo cual puede ayudar a evitar un desgarro perineal. ? En algunos casos, se puede hacer una incisin en el peritoneo (episiotoma) para permitir que el beb pase a travs de la abertura vaginal. La episiotoma sirve para agrandar la abertura vaginal a fin de que el beb tenga ms espacio para pasar durante el parto.  Es muy importante respirar y concentrarse para que el mdico pueda controlar la salida de la cabeza del beb. Es posible que su mdico tenga que disminuir la intensidad de los pujos para ayudar a evitar un desgarro perineal.  Despus de que sale la cabeza del beb, en general salen los hombros y el resto del cuerpo muy  rpidamente y sin dificultad.  Una vez que el beb nace, se debe cortar el cordn umbilical de inmediato o esto puede demorar 1 o 2 minutos, segn la salud del beb. Este procedimiento puede variar segn el mdico, el hospital y el centro de parto.  Si usted y su beb estn lo suficientemente sanos, se le colocar el beb en el pecho o el abdomen para ayudar a mantener la temperatura del beb y el vnculo entre ustedes. Algunas madres y bebs comienzan la lactancia en este momento. Su equipo mdico secar al beb y lo ayudar a mantenerse caliente durante este tiempo.  Es posible que su beb necesite atencin   inmediata si: ? Mostr signos de sufrimiento durante el trabajo de parto. ? Tiene una afeccin mdica. ? Naci antes de tiempo (prematuro). ? Defeca antes del nacimiento (meconio). ? Muestra signos de dificultar en la transicin de estar dentro del tero a estar fuera del tero. Si tiene planeado amamantar, su equipo mdico la ayudar a comenzar la lactancia. Etapa 3  La tercera etapa del trabajo de parto comienza inmediatamente despus del nacimiento de su beb y finaliza despus de la expulsin de la placenta. La placenta es un rgano de que desarrolla durante el embarazo para proporcionar oxgeno y nutrientes al beb en el tero.  La expulsin de la placenta puede requerir algunos pujos y es posible que usted tenga contracciones leves. La lactancia puede estimular las contracciones para ayudar a expulsar la placenta.  Luego de la expulsin de la placenta, el tero debe (contraerse) y quedar muy firme. Esto ayuda a detener el sangrado en el tero. Para ayudar al tero a contraerse y controlar el sangrado, su mdico puede: ? Administrarle un medicamento inyectable, a travs de un tubo (catter) intravenoso, por boca o a travs del recto (por va rectal). ? Masajear el abdomen o realizar un examen de la vagina para extraer cualquier cogulo de sangre que quede en el tero. ? Vaciar la  vejiga colocando un tubo flexible (catter) en la vejiga. ? Alentarla a amamantar a su beb. Una vez que termina el trabajo de parto, se los controlar a usted y a su beb atentamente para tener la seguridad de que ambos estn sanos hasta que estn listos para ir a casa. Su equipo de atencin mdica le ensear cmo cuidarse y cuidar a su beb. Esta informacin no tiene como fin reemplazar el consejo del mdico. Asegrese de hacerle al mdico cualquier pregunta que tenga. Document Released: 03/24/2008 Document Revised: 07/21/2016 Document Reviewed: 04/26/2015 Elsevier Interactive Patient Education  2018 [employer]  

## 2017-12-28 NOTE — Progress Notes (Signed)
ROB- cultures obtained today, pt is having a lot of pelvic pressure 

## 2017-12-28 NOTE — Progress Notes (Addendum)
ROB-Reports increased pelvic pressure. Requests SVE. 36 week cultures collected. Herbal prep handout given. Rx Breast pump. Last 17p injection given. Anticipatory guidance regarding course of prenatal care. Reviewed red flag symptoms and when to call. RTC x 1 week for ROB or sooner if needed.

## 2017-12-31 LAB — GC/CHLAMYDIA PROBE AMP
Chlamydia trachomatis, NAA: NEGATIVE
Neisseria gonorrhoeae by PCR: NEGATIVE

## 2017-12-31 LAB — STREP GP B NAA: Strep Gp B NAA: NEGATIVE

## 2018-01-03 ENCOUNTER — Encounter: Payer: Self-pay | Admitting: Certified Nurse Midwife

## 2018-01-03 ENCOUNTER — Ambulatory Visit (INDEPENDENT_AMBULATORY_CARE_PROVIDER_SITE_OTHER): Payer: BLUE CROSS/BLUE SHIELD | Admitting: Certified Nurse Midwife

## 2018-01-03 VITALS — BP 116/80 | HR 105 | Wt 180.1 lb

## 2018-01-03 DIAGNOSIS — Z3493 Encounter for supervision of normal pregnancy, unspecified, third trimester: Secondary | ICD-10-CM | POA: Diagnosis not present

## 2018-01-03 LAB — POCT URINALYSIS DIPSTICK OB
Bilirubin, UA: NEGATIVE
GLUCOSE, UA: NEGATIVE
LEUKOCYTES UA: NEGATIVE
NITRITE UA: NEGATIVE
RBC UA: NEGATIVE
SPEC GRAV UA: 1.025 (ref 1.010–1.025)
Urobilinogen, UA: 0.2 E.U./dL
pH, UA: 6 (ref 5.0–8.0)

## 2018-01-03 NOTE — Patient Instructions (Signed)
Braxton Hicks Contractions °Contractions of the uterus can occur throughout pregnancy, but they are not always a sign that you are in labor. You may have practice contractions called Braxton Hicks contractions. These false labor contractions are sometimes confused with true labor. °What are Braxton Hicks contractions? °Braxton Hicks contractions are tightening movements that occur in the muscles of the uterus before labor. Unlike true labor contractions, these contractions do not result in opening (dilation) and thinning of the cervix. Toward the end of pregnancy (32-34 weeks), Braxton Hicks contractions can happen more often and may become stronger. These contractions are sometimes difficult to tell apart from true labor because they can be very uncomfortable. You should not feel embarrassed if you go to the hospital with false labor. °Sometimes, the only way to tell if you are in true labor is for your health care provider to look for changes in the cervix. The health care provider will do a physical exam and may monitor your contractions. If you are not in true labor, the exam should show that your cervix is not dilating and your water has not broken. °If there are other health problems associated with your pregnancy, it is completely safe for you to be sent home with false labor. You may continue to have Braxton Hicks contractions until you go into true labor. °How to tell the difference between true labor and false labor °True labor °· Contractions last 30-70 seconds. °· Contractions become very regular. °· Discomfort is usually felt in the top of the uterus, and it spreads to the lower abdomen and low back. °· Contractions do not go away with walking. °· Contractions usually become more intense and increase in frequency. °· The cervix dilates and gets thinner. °False labor °· Contractions are usually shorter and not as strong as true labor contractions. °· Contractions are usually irregular. °· Contractions  are often felt in the front of the lower abdomen and in the groin. °· Contractions may go away when you walk around or change positions while lying down. °· Contractions get weaker and are shorter-lasting as time goes on. °· The cervix usually does not dilate or become thin. °Follow these instructions at home: °· Take over-the-counter and prescription medicines only as told by your health care provider. °· Keep up with your usual exercises and follow other instructions from your health care provider. °· Eat and drink lightly if you think you are going into labor. °· If Braxton Hicks contractions are making you uncomfortable: °? Change your position from lying down or resting to walking, or change from walking to resting. °? Sit and rest in a tub of warm water. °? Drink enough fluid to keep your urine pale yellow. Dehydration may cause these contractions. °? Do slow and deep breathing several times an hour. °· Keep all follow-up prenatal visits as told by your health care provider. This is important. °Contact a health care provider if: °· You have a fever. °· You have continuous pain in your abdomen. °Get help right away if: °· Your contractions become stronger, more regular, and closer together. °· You have fluid leaking or gushing from your vagina. °· You pass blood-tinged mucus (bloody show). °· You have bleeding from your vagina. °· You have low back pain that you never had before. °· You feel your baby’s head pushing down and causing pelvic pressure. °· Your baby is not moving inside you as much as it used to. °Summary °· Contractions that occur before labor are called Braxton   Hicks contractions, false labor, or practice contractions. °· Braxton Hicks contractions are usually shorter, weaker, farther apart, and less regular than true labor contractions. True labor contractions usually become progressively stronger and regular and they become more frequent. °· Manage discomfort from Braxton Hicks contractions by  changing position, resting in a warm bath, drinking plenty of water, or practicing deep breathing. °This information is not intended to replace advice given to you by your health care provider. Make sure you discuss any questions you have with your health care provider. °Document Released: 08/25/2016 Document Revised: 08/25/2016 Document Reviewed: 08/25/2016 °Elsevier Interactive Patient Education © 2018 [employer] ° °

## 2018-01-03 NOTE — Progress Notes (Signed)
ROB, doing well. Feels good movement. Has increased vaginal pressure. SVE per pt request- no change from last visit 1cm. Pt state her husband was in an accident and had his finger cut off. She is requesting a work note. Note was given. Labor precautions reviewed. Follow up 1 wk.   A medical interpretor was present for this visit.   Doreene Burke, CNM

## 2018-01-11 ENCOUNTER — Ambulatory Visit (INDEPENDENT_AMBULATORY_CARE_PROVIDER_SITE_OTHER): Payer: BLUE CROSS/BLUE SHIELD | Admitting: Certified Nurse Midwife

## 2018-01-11 VITALS — BP 120/84 | HR 94 | Wt 182.0 lb

## 2018-01-11 DIAGNOSIS — Z3493 Encounter for supervision of normal pregnancy, unspecified, third trimester: Secondary | ICD-10-CM

## 2018-01-11 LAB — POCT URINALYSIS DIPSTICK OB
BILIRUBIN UA: NEGATIVE
GLUCOSE, UA: NEGATIVE
Ketones, UA: NEGATIVE
Nitrite, UA: NEGATIVE
POC,PROTEIN,UA: NEGATIVE
RBC UA: NEGATIVE
Spec Grav, UA: 1.01 (ref 1.010–1.025)
Urobilinogen, UA: 0.2 E.U./dL
pH, UA: 6.5 (ref 5.0–8.0)

## 2018-01-11 NOTE — Progress Notes (Signed)
Pt presents today for ROB and cervix check, has no concerns and doing well.

## 2018-01-11 NOTE — Patient Instructions (Signed)
Vaginal Delivery Vaginal delivery means that you will give birth by pushing your baby out of your birth canal (vagina). A team of health care providers will help you before, during, and after vaginal delivery. Birth experiences are unique for every woman and every pregnancy, and birth experiences vary depending on where you choose to give birth. What should I do to prepare for my baby's birth? Before your baby is born, it is important to talk with your health care provider about:  Your labor and delivery preferences. These may include: ? Medicines that you may be given. ? How you will manage your pain. This might include non-medical pain relief techniques or injectable pain relief such as epidural analgesia. ? How you and your baby will be monitored during labor and delivery. ? Who may be in the labor and delivery room with you. ? Your feelings about surgical delivery of your baby (cesarean delivery, or C-section) if this becomes necessary. ? Your feelings about receiving donated blood through an IV tube (blood transfusion) if this becomes necessary.  Whether you are able: ? To take pictures or videos of the birth. ? To eat during labor and delivery. ? To move around, walk, or change positions during labor and delivery.  What to expect after your baby is born, such as: ? Whether delayed umbilical cord clamping and cutting is offered. ? Who will care for your baby right after birth. ? Medicines or tests that may be recommended for your baby. ? Whether breastfeeding is supported in your hospital or birth center. ? How long you will be in the hospital or birth center.  How any medical conditions you have may affect your baby or your labor and delivery experience.  To prepare for your baby's birth, you should also:  Attend all of your health care visits before delivery (prenatal visits) as recommended by your health care provider. This is important.  Prepare your home for your baby's  arrival. Make sure that you have: ? Diapers. ? Baby clothing. ? Feeding equipment. ? Safe sleeping arrangements for you and your baby.  Install a car seat in your vehicle. Have your car seat checked by a certified car seat installer to make sure that it is installed safely.  Think about who will help you with your new baby at home for at least the first several weeks after delivery.  What can I expect when I arrive at the birth center or hospital? Once you are in labor and have been admitted into the hospital or birth center, your health care provider may:  Review your pregnancy history and any concerns you have.  Insert an IV tube into one of your veins. This is used to give you fluids and medicines.  Check your blood pressure, pulse, temperature, and heart rate (vital signs).  Check whether your bag of water (amniotic sac) has broken (ruptured).  Talk with you about your birth plan and discuss pain control options.  Monitoring Your health care provider may monitor your contractions (uterine monitoring) and your baby's heart rate (fetal monitoring). You may need to be monitored:  Often, but not continuously (intermittently).  All the time or for long periods at a time (continuously). Continuous monitoring may be needed if: ? You are taking certain medicines, such as medicine to relieve pain or make your contractions stronger. ? You have pregnancy or labor complications.  Monitoring may be done by:  Placing a special stethoscope or a handheld monitoring device on your abdomen to   check your baby's heartbeat, and feeling your abdomen for contractions. This method of monitoring does not continuously record your baby's heartbeat or your contractions.  Placing monitors on your abdomen (external monitors) to record your baby's heartbeat and the frequency and length of contractions. You may not have to wear external monitors all the time.  Placing monitors inside of your uterus  (internal monitors) to record your baby's heartbeat and the frequency, length, and strength of your contractions. ? Your health care provider may use internal monitors if he or she needs more information about the strength of your contractions or your baby's heart rate. ? Internal monitors are put in place by passing a thin, flexible wire through your vagina and into your uterus. Depending on the type of monitor, it may remain in your uterus or on your baby's head until birth. ? Your health care provider will discuss the benefits and risks of internal monitoring with you and will ask for your permission before inserting the monitors.  Telemetry. This is a type of continuous monitoring that can be done with external or internal monitors. Instead of having to stay in bed, you are able to move around during telemetry. Ask your health care provider if telemetry is an option for you.  Physical exam Your health care provider may perform a physical exam. This may include:  Checking whether your baby is positioned: ? With the head toward your vagina (head-down). This is most common. ? With the head toward the top of your uterus (head-up or breech). If your baby is in a breech position, your health care provider may try to turn your baby to a head-down position so you can deliver vaginally. If it does not seem that your baby can be born vaginally, your provider may recommend surgery to deliver your baby. In rare cases, you may be able to deliver vaginally if your baby is head-up (breech delivery). ? Lying sideways (transverse). Babies that are lying sideways cannot be delivered vaginally.  Checking your cervix to determine: ? Whether it is thinning out (effacing). ? Whether it is opening up (dilating). ? How low your baby has moved into your birth canal.  What are the three stages of labor and delivery?  Normal labor and delivery is divided into the following three stages: Stage 1  Stage 1 is the  longest stage of labor, and it can last for hours or days. Stage 1 includes: ? Early labor. This is when contractions may be irregular, or regular and mild. Generally, early labor contractions are more than 10 minutes apart. ? Active labor. This is when contractions get longer, more regular, more frequent, and more intense. ? The transition phase. This is when contractions happen very close together, are very intense, and may last longer than during any other part of labor.  Contractions generally feel mild, infrequent, and irregular at first. They get stronger, more frequent (about every 2-3 minutes), and more regular as you progress from early labor through active labor and transition.  Many women progress through stage 1 naturally, but you may need help to continue making progress. If this happens, your health care provider may talk with you about: ? Rupturing your amniotic sac if it has not ruptured yet. ? Giving you medicine to help make your contractions stronger and more frequent.  Stage 1 ends when your cervix is completely dilated to 4 inches (10 cm) and completely effaced. This happens at the end of the transition phase. Stage 2  Once   your cervix is completely effaced and dilated to 4 inches (10 cm), you may start to feel an urge to push. It is common for the body to naturally take a rest before feeling the urge to push, especially if you received an epidural or certain other pain medicines. This rest period may last for up to 1-2 hours, depending on your unique labor experience.  During stage 2, contractions are generally less painful, because pushing helps relieve contraction pain. Instead of contraction pain, you may feel stretching and burning pain, especially when the widest part of your baby's head passes through the vaginal opening (crowning).  Your health care provider will closely monitor your pushing progress and your baby's progress through the vagina during stage 2.  Your  health care provider may massage the area of skin between your vaginal opening and anus (perineum) or apply warm compresses to your perineum. This helps it stretch as the baby's head starts to crown, which can help prevent perineal tearing. ? In some cases, an incision may be made in your perineum (episiotomy) to allow the baby to pass through the vaginal opening. An episiotomy helps to make the opening of the vagina larger to allow more room for the baby to fit through.  It is very important to breathe and focus so your health care provider can control the delivery of your baby's head. Your health care provider may have you decrease the intensity of your pushing, to help prevent perineal tearing.  After delivery of your baby's head, the shoulders and the rest of the body generally deliver very quickly and without difficulty.  Once your baby is delivered, the umbilical cord may be cut right away, or this may be delayed for 1-2 minutes, depending on your baby's health. This may vary among health care providers, hospitals, and birth centers.  If you and your baby are healthy enough, your baby may be placed on your chest or abdomen to help maintain the baby's temperature and to help you bond with each other. Some mothers and babies start breastfeeding at this time. Your health care team will dry your baby and help keep your baby warm during this time.  Your baby may need immediate care if he or she: ? Showed signs of distress during labor. ? Has a medical condition. ? Was born too early (prematurely). ? Had a bowel movement before birth (meconium). ? Shows signs of difficulty transitioning from being inside the uterus to being outside of the uterus. If you are planning to breastfeed, your health care team will help you begin a feeding. Stage 3  The third stage of labor starts immediately after the birth of your baby and ends after you deliver the placenta. The placenta is an organ that develops  during pregnancy to provide oxygen and nutrients to your baby in the womb.  Delivering the placenta may require some pushing, and you may have mild contractions. Breastfeeding can stimulate contractions to help you deliver the placenta.  After the placenta is delivered, your uterus should tighten (contract) and become firm. This helps to stop bleeding in your uterus. To help your uterus contract and to control bleeding, your health care provider may: ? Give you medicine by injection, through an IV tube, by mouth, or through your rectum (rectally). ? Massage your abdomen or perform a vaginal exam to remove any blood clots that are left in your uterus. ? Empty your bladder by placing a thin, flexible tube (catheter) into your bladder. ? Encourage   you to breastfeed your baby. After labor is over, you and your baby will be monitored closely to ensure that you are both healthy until you are ready to go home. Your health care team will teach you how to care for yourself and your baby. This information is not intended to replace advice given to you by your health care provider. Make sure you discuss any questions you have with your health care provider. Document Released: 01/19/2008 Document Revised: 10/30/2015 Document Reviewed: 04/26/2015 Elsevier Interactive Patient Education  2018 [employer]vier Inc[employer]  

## 2018-01-13 ENCOUNTER — Inpatient Hospital Stay
Admission: EM | Admit: 2018-01-13 | Discharge: 2018-01-15 | DRG: 807 | Disposition: A | Discharge status: Home / Self Care | Payer: BLUE CROSS/BLUE SHIELD | Attending: Certified Nurse Midwife | Admitting: Certified Nurse Midwife

## 2018-01-13 DIAGNOSIS — O09899 Supervision of other high risk pregnancies, unspecified trimester: Secondary | ICD-10-CM

## 2018-01-13 DIAGNOSIS — O09219 Supervision of pregnancy with history of pre-term labor, unspecified trimester: Secondary | ICD-10-CM

## 2018-01-13 DIAGNOSIS — Z3A39 39 weeks gestation of pregnancy: Secondary | ICD-10-CM

## 2018-01-13 DIAGNOSIS — Z8759 Personal history of other complications of pregnancy, childbirth and the puerperium: Secondary | ICD-10-CM

## 2018-01-13 NOTE — OB Triage Note (Signed)
Pt presents c/o that started this morning 10/10. Pt has had some vaginal bleeding. Denies LOF. Pt reports positive fetal movement. Will continue to monitor.Vitals WNL. Will continue to monitor.

## 2018-01-14 ENCOUNTER — Other Ambulatory Visit: Payer: Self-pay

## 2018-01-14 ENCOUNTER — Inpatient Hospital Stay: Payer: BLUE CROSS/BLUE SHIELD | Admitting: Anesthesiology

## 2018-01-14 DIAGNOSIS — Z3A39 39 weeks gestation of pregnancy: Secondary | ICD-10-CM | POA: Diagnosis not present

## 2018-01-14 DIAGNOSIS — Z3483 Encounter for supervision of other normal pregnancy, third trimester: Secondary | ICD-10-CM | POA: Diagnosis present

## 2018-01-14 LAB — CBC
HCT: 36.4 % (ref 35.0–47.0)
Hemoglobin: 12.8 g/dL (ref 12.0–16.0)
MCH: 30.7 pg (ref 26.0–34.0)
MCHC: 35.2 g/dL (ref 32.0–36.0)
MCV: 87.3 fL (ref 80.0–100.0)
PLATELETS: 167 10*3/uL (ref 150–440)
RBC: 4.17 MIL/uL (ref 3.80–5.20)
RDW: 14.3 % (ref 11.5–14.5)
WBC: 7.4 10*3/uL (ref 3.6–11.0)

## 2018-01-14 LAB — TYPE AND SCREEN
ABO/RH(D): O POS
Antibody Screen: NEGATIVE

## 2018-01-14 MED ORDER — SODIUM CHLORIDE 0.9% FLUSH
3.0000 mL | Freq: Two times a day (BID) | INTRAVENOUS | Status: DC
  Administered 2018-01-15: 3 mL via INTRAVENOUS

## 2018-01-14 MED ORDER — ACETAMINOPHEN 325 MG PO TABS: 650.0000 mg | ORAL_TABLET | ORAL | Status: DC | PRN

## 2018-01-14 MED ORDER — PHENYLEPHRINE 40 MCG/ML (10ML) SYRINGE FOR IV PUSH (FOR BLOOD PRESSURE SUPPORT): 80.0000 ug | PREFILLED_SYRINGE | INTRAVENOUS | Status: DC | PRN

## 2018-01-14 MED ORDER — FENTANYL 2.5 MCG/ML W/ROPIVACAINE 0.15% IN NS 100 ML EPIDURAL (ARMC)
EPIDURAL | Status: DC | PRN
  Administered 2018-01-14: 12 mL/h via EPIDURAL

## 2018-01-14 MED ORDER — DIPHENHYDRAMINE HCL 50 MG/ML IJ SOLN: 12.5000 mg | INTRAMUSCULAR | Status: DC | PRN

## 2018-01-14 MED ORDER — FLEET ENEMA 7-19 GM/118ML RE ENEM: 1.0000 | ENEMA | Freq: Every day | RECTAL | Status: DC | PRN

## 2018-01-14 MED ORDER — DIPHENHYDRAMINE HCL 25 MG PO CAPS: 25.0000 mg | ORAL_CAPSULE | Freq: Four times a day (QID) | ORAL | Status: DC | PRN

## 2018-01-14 MED ORDER — OXYCODONE-ACETAMINOPHEN 5-325 MG PO TABS: 2.0000 | ORAL_TABLET | ORAL | Status: DC | PRN

## 2018-01-14 MED ORDER — PRENATAL MULTIVITAMIN CH
1.0000 | ORAL_TABLET | Freq: Every day | ORAL | Status: DC
  Administered 2018-01-14 – 2018-01-15 (×2): 1 via ORAL
  Filled 2018-01-14 (×2): qty 1

## 2018-01-14 MED ORDER — OXYTOCIN 10 UNIT/ML IJ SOLN
INTRAMUSCULAR | Status: AC
  Filled 2018-01-14: qty 2

## 2018-01-14 MED ORDER — ONDANSETRON HCL 4 MG/2ML IJ SOLN: 4.0000 mg | Freq: Four times a day (QID) | INTRAMUSCULAR | Status: DC | PRN

## 2018-01-14 MED ORDER — SODIUM CHLORIDE 0.9 % IV SOLN
INTRAVENOUS | Status: DC | PRN
  Administered 2018-01-14 (×3): 5 mL via EPIDURAL

## 2018-01-14 MED ORDER — OXYTOCIN BOLUS FROM INFUSION
500.0000 mL | Freq: Once | INTRAVENOUS | Status: AC
  Administered 2018-01-14: 500 mL via INTRAVENOUS

## 2018-01-14 MED ORDER — LIDOCAINE HCL (PF) 1 % IJ SOLN
INTRAMUSCULAR | Status: AC
  Filled 2018-01-14: qty 30

## 2018-01-14 MED ORDER — SENNOSIDES-DOCUSATE SODIUM 8.6-50 MG PO TABS
2.0000 | ORAL_TABLET | ORAL | Status: DC
  Administered 2018-01-15: 2 via ORAL
  Filled 2018-01-14 (×2): qty 2

## 2018-01-14 MED ORDER — LACTATED RINGERS IV SOLN
INTRAVENOUS | Status: DC
  Administered 2018-01-14 (×2): via INTRAVENOUS

## 2018-01-14 MED ORDER — EPHEDRINE 5 MG/ML INJ: 10.0000 mg | INTRAVENOUS | Status: DC | PRN

## 2018-01-14 MED ORDER — METHYLERGONOVINE MALEATE 0.2 MG PO TABS
0.2000 mg | ORAL_TABLET | ORAL | Status: DC | PRN
  Filled 2018-01-14: qty 1

## 2018-01-14 MED ORDER — BUTORPHANOL TARTRATE 2 MG/ML IJ SOLN
1.0000 mg | INTRAMUSCULAR | Status: DC | PRN
  Administered 2018-01-14: 1 mg via INTRAVENOUS
  Filled 2018-01-14: qty 1

## 2018-01-14 MED ORDER — MISOPROSTOL 200 MCG PO TABS
ORAL_TABLET | ORAL | Status: AC
  Filled 2018-01-14: qty 4

## 2018-01-14 MED ORDER — COCONUT OIL OIL: 1.0000 "application " | TOPICAL_OIL | Status: DC | PRN

## 2018-01-14 MED ORDER — LACTATED RINGERS IV SOLN
500.0000 mL | INTRAVENOUS | Status: DC | PRN
  Administered 2018-01-14: 500 mL via INTRAVENOUS

## 2018-01-14 MED ORDER — IBUPROFEN 600 MG PO TABS
600.0000 mg | ORAL_TABLET | Freq: Four times a day (QID) | ORAL | Status: DC
  Administered 2018-01-14 – 2018-01-15 (×5): 600 mg via ORAL
  Filled 2018-01-14 (×5): qty 1

## 2018-01-14 MED ORDER — WITCH HAZEL-GLYCERIN EX PADS: 1.0000 "application " | MEDICATED_PAD | CUTANEOUS | Status: DC | PRN

## 2018-01-14 MED ORDER — SODIUM CHLORIDE 0.9 % IV SOLN: 250.0000 mL | INTRAVENOUS | Status: DC | PRN

## 2018-01-14 MED ORDER — PHENYLEPHRINE 40 MCG/ML (10ML) SYRINGE FOR IV PUSH (FOR BLOOD PRESSURE SUPPORT)
PREFILLED_SYRINGE | INTRAVENOUS | Status: AC
  Filled 2018-01-14: qty 10

## 2018-01-14 MED ORDER — ONDANSETRON HCL 4 MG PO TABS: 4.0000 mg | ORAL_TABLET | ORAL | Status: DC | PRN

## 2018-01-14 MED ORDER — SOD CITRATE-CITRIC ACID 500-334 MG/5ML PO SOLN: 30.0000 mL | ORAL | Status: DC | PRN

## 2018-01-14 MED ORDER — AMMONIA AROMATIC IN INHA
RESPIRATORY_TRACT | Status: AC
  Filled 2018-01-14: qty 10

## 2018-01-14 MED ORDER — LIDOCAINE HCL (PF) 1 % IJ SOLN: 30.0000 mL | INTRAMUSCULAR | Status: DC | PRN

## 2018-01-14 MED ORDER — ONDANSETRON HCL 4 MG/2ML IJ SOLN: 4.0000 mg | INTRAMUSCULAR | Status: DC | PRN

## 2018-01-14 MED ORDER — METHYLERGONOVINE MALEATE 0.2 MG/ML IJ SOLN: 0.2000 mg | INTRAMUSCULAR | Status: DC | PRN

## 2018-01-14 MED ORDER — BENZOCAINE-MENTHOL 20-0.5 % EX AERO: 1.0000 "application " | INHALATION_SPRAY | CUTANEOUS | Status: DC | PRN

## 2018-01-14 MED ORDER — DIBUCAINE 1 % RE OINT: 1.0000 "application " | TOPICAL_OINTMENT | RECTAL | Status: DC | PRN

## 2018-01-14 MED ORDER — SODIUM CHLORIDE 0.9% FLUSH: 3.0000 mL | INTRAVENOUS | Status: DC | PRN

## 2018-01-14 MED ORDER — FENTANYL 2.5 MCG/ML W/ROPIVACAINE 0.15% IN NS 100 ML EPIDURAL (ARMC)
EPIDURAL | Status: AC
  Filled 2018-01-14: qty 100

## 2018-01-14 MED ORDER — OXYTOCIN 40 UNITS IN LACTATED RINGERS INFUSION - SIMPLE MED
2.5000 [IU]/h | INTRAVENOUS | Status: DC
  Filled 2018-01-14: qty 1000

## 2018-01-14 MED ORDER — LIDOCAINE HCL (PF) 1 % IJ SOLN
INTRAMUSCULAR | Status: DC | PRN
  Administered 2018-01-14: 1 mL

## 2018-01-14 MED ORDER — FENTANYL 2.5 MCG/ML W/ROPIVACAINE 0.15% IN NS 100 ML EPIDURAL (ARMC): 12.0000 mL/h | EPIDURAL | Status: DC

## 2018-01-14 MED ORDER — SIMETHICONE 80 MG PO CHEW: 80.0000 mg | CHEWABLE_TABLET | ORAL | Status: DC | PRN

## 2018-01-14 MED ORDER — OXYCODONE-ACETAMINOPHEN 5-325 MG PO TABS: 1.0000 | ORAL_TABLET | ORAL | Status: DC | PRN

## 2018-01-14 MED ORDER — LACTATED RINGERS IV SOLN: 500.0000 mL | Freq: Once | INTRAVENOUS | Status: DC

## 2018-01-14 NOTE — H&P (Addendum)
Obstetric History and Physical  Tracy Mata is a 27 y.o. G2P0101 with IUP at [redacted]w[redacted]d presenting with vaginal bleeding and contractions since 01/13/2018.   Patient states she has been having  Regular contractions, minimal vaginal bleeding, intact membranes, with active fetal movement.    Denies difficulty breathing or respiratory distress, chest pain, dysuria, and leg pain or swelling.   Prenatal Course  Source of Care: [facility]-initial visit at 13 wks, total visits: 13  Pregnancy complications or risks: History of preterm premature rupture of membranes, History of preterm birth, History fetal enlarged left ventricle-normal on repeat ultrasound, History of elevated one hour glucola  Prenatal labs and studies:  ABO, Rh: --/--/O POS (09/22 0214)  Antibody: NEG (09/22 0214)  Rubella: 4.80 (03/27 1638)  Varicella: 218 (03/27 1638)  RPR: Non Reactive (07/02 0927)   HBsAg: Negative (03/27 1638)   HIV: Non Reactive (03/27 1638)   ZOX:WRUEAVWU (09/06 1111)  1 hr Glucola: 171 (07/02 0927)  Gestational Glucose Tolerance: 80, 133, 95, 125 (07/15 0848)  Genetic screening: Declined  Anatomy US: Complete, normal (06/21 1453)  History reviewed. No pertinent past medical history.  Past Surgical History:  Procedure Laterality Date  . FACIAL LACERATION REPAIR    . TONGUE SURGERY      OB History  Gravida Para Term Preterm AB Living  2 1   1   1   SAB TAB Ectopic Multiple Live Births          1    # Outcome Date GA Lbr Len/2nd Weight Sex Delivery Anes PTL Lv  2 Current           1 Preterm 2014   3941 g M Vag-Spont  Y LIV    Social History   Socioeconomic History  . Marital status: Married    Spouse name: Not on file  . Number of children: Not on file  . Years of education: Not on file  . Highest education level: Not on file  Occupational History  . Not on file  Social Needs  . Financial resource strain: Not on file  . Food insecurity:    Worry: Not on file     Inability: Not on file  . Transportation needs:    Medical: Not on file    Non-medical: Not on file  Tobacco Use  . Smoking status: Never Smoker  . Smokeless tobacco: Never Used  Substance and Sexual Activity  . Alcohol use: No  . Drug use: Never  . Sexual activity: Yes    Birth control/protection: None  Lifestyle  . Physical activity:    Days per week: Not on file    Minutes per session: Not on file  . Stress: Not on file  Relationships  . Social connections:    Talks on phone: Not on file    Gets together: Not on file    Attends religious service: Not on file    Active member of club or organization: Not on file    Attends meetings of clubs or organizations: Not on file    Relationship status: Not on file  Other Topics Concern  . Not on file  Social History Narrative  . Not on file    Family History  Problem Relation Age of Onset  . Heart disease Paternal Grandmother   . Diabetes Paternal Grandfather     Medications Prior to Admission  Medication Sig Dispense Refill Last Dose  . Prenatal Vit-Fe Fumarate-FA (PRENATAL MULTIVITAMIN) TABS tablet Take 1 tablet  by mouth daily at 12 noon.   01/13/2018 at Unknown time    No Known Allergies  Review of Systems: Negative except for what is mentioned in HPI.  Physical Exam:  Temp:  [97.6 F (36.4 C)-98.3 F (36.8 C)] 98.3 F (36.8 C) (09/22 0155) Pulse Rate:  [66-96] 83 (09/22 0428) Resp:  [18] 18 (09/22 0155) BP: (108-133)/(76-96) 109/83 (09/22 0428) SpO2:  [99 %-100 %] 99 % (09/22 0410) Weight:  [81.6 kg] 81.6 kg (09/22 0005)  GENERAL: Well-developed, well-nourished female in no acute distress.   LUNGS: Clear to auscultation bilaterally.   HEART: Regular rate and rhythm.  ABDOMEN: Soft, nontender, nondistended, gravid.  EXTREMITIES: Nontender, no edema, 2+ distal pulses.  Cervical Exam: Dilation: 7.5 Effacement (%): 90 Cervical Position: Middle Station: 0 Presentation: Vertex Exam by:: MLawhorn CNM   AROM clear fluid, moderate amount  FHT:  Baseline rate 120 bpm   Variability moderate  Accelerations present   Decelerations none  Contractions: Every two (2) to six (6) minutes; soft resting tone   Pertinent Labs/Studies:   Results for orders placed or performed during the hospital encounter of 01/13/18 (from the past 24 hour(s))  Type and screen [facility]     Status: None   Collection Time: 01/14/18  2:14 AM  Result Value Ref Range   ABO/RH(D) O POS    Antibody Screen NEG    Sample Expiration      01/17/2018 Performed at [facility], [address] [place], [place] [address]   CBC     Status: None   Collection Time: 01/14/18  2:16 AM  Result Value Ref Range   WBC 7.4 3.6 - 11.0 K/uL   RBC 4.17 3.80 - 5.20 MIL/uL   Hemoglobin 12.8 12.0 - 16.0 g/dL   HCT 57.836.4 46.935.0 - 62.947.0 %   MCV 87.3 80.0 - 100.0 fL   MCH 30.7 26.0 - 34.0 pg   MCHC 35.2 32.0 - 36.0 g/dL   RDW 52.814.3 41.311.5 - 24.414.5 %   Platelets 167 150 - 440 K/uL    Assessment :  Tracy Mata is a 27 y.o. G2P0101 at 4010w0d being admitted for labor, Rh positive, GBS negative, epidural for pain management, plans breastfeeding with formula supplementation, History of preterm premature rupture of membranes, History of preterm birth-use 17P  FHR Category I  Plan:  Admit to birthing suites, see orders.   Labor: Expectant management.    Induction/Augmentation as needed, per protocol.  Plan of care options discussed with patient and family members. Desires AROM at this time.   Encouraged position change and use of peanut ball.   Delivery plan: Hopeful for vaginal delivery.   Dr. Valentino Saxonherry notified of admission and plan of care.    Gunnar BullaJenkins Michelle Jaydence Vanyo, CNM [facility], [facility] 01/14/18 8:01 AM

## 2018-01-14 NOTE — Anesthesia Preprocedure Evaluation (Signed)
Anesthesia Evaluation  Patient identified by MRN, date of birth, ID band Patient awake    Reviewed: Allergy & Precautions, H&P , NPO status , Patient's Chart, lab work & pertinent test results  Airway Mallampati: III  TM Distance: >3 FB Neck ROM: full    Dental  (+) Teeth Intact   Pulmonary neg pulmonary ROS,           Cardiovascular Exercise Tolerance: Good (-) hypertensionnegative cardio ROS       Neuro/Psych    GI/Hepatic negative GI ROS,   Endo/Other    Renal/GU   negative genitourinary   Musculoskeletal   Abdominal   Peds  Hematology negative hematology ROS (+)   Anesthesia Other Findings History reviewed. No pertinent past medical history.  Past Surgical History: No date: FACIAL LACERATION REPAIR No date: TONGUE SURGERY  BMI    Body Mass Index:  35.15 kg/m      Reproductive/Obstetrics (+) Pregnancy                             Anesthesia Physical Anesthesia Plan  ASA: II  Anesthesia Plan: Epidural   Post-op Pain Management:    Induction:   PONV Risk Score and Plan:   Airway Management Planned:   Additional Equipment:   Intra-op Plan:   Post-operative Plan:   Informed Consent: I have reviewed the patients History and Physical, chart, labs and discussed the procedure including the risks, benefits and alternatives for the proposed anesthesia with the patient or authorized representative who has indicated his/her understanding and acceptance.     Plan Discussed with: Anesthesiologist  Anesthesia Plan Comments:         Anesthesia Quick Evaluation

## 2018-01-14 NOTE — Anesthesia Procedure Notes (Signed)
Epidural Patient location during procedure: OB Start time: 01/14/2018 3:36 AM End time: 01/14/2018 3:53 AM  Staffing Anesthesiologist: Jovita GammaFitzgerald, Willliam Pettet L, MD Performed: anesthesiologist   Preanesthetic Checklist Completed: patient identified, site marked, surgical consent, pre-op evaluation, timeout performed, IV checked, risks and benefits discussed and monitors and equipment checked  Epidural Patient position: sitting Prep: ChloraPrep Patient monitoring: heart rate, continuous pulse ox and blood pressure Approach: midline Location: L4-L5 Injection technique: LOR saline  Needle:  Needle type: Tuohy  Needle gauge: 18 G Needle length: 9 cm and 9 Needle insertion depth: 6 cm Catheter type: closed end flexible Catheter size: 20 Guage Catheter at skin depth: 11 cm Test dose: negative and Other  Assessment Events: blood not aspirated, injection not painful, no injection resistance, negative IV test and no paresthesia  Additional Notes   Patient tolerated the insertion well without complications.Reason for block:procedure for pain

## 2018-01-15 LAB — CBC
HCT: 35.3 % (ref 35.0–47.0)
HEMOGLOBIN: 12.4 g/dL (ref 12.0–16.0)
MCH: 30.5 pg (ref 26.0–34.0)
MCHC: 35 g/dL (ref 32.0–36.0)
MCV: 87.3 fL (ref 80.0–100.0)
PLATELETS: 131 10*3/uL — AB (ref 150–440)
RBC: 4.05 MIL/uL (ref 3.80–5.20)
RDW: 14.3 % (ref 11.5–14.5)
WBC: 8.1 10*3/uL (ref 3.6–11.0)

## 2018-01-15 LAB — RPR: RPR Ser Ql: NONREACTIVE

## 2018-01-15 MED ORDER — IBUPROFEN 600 MG PO TABS: 600.0000 mg | ORAL_TABLET | Freq: Four times a day (QID) | ORAL | 0 refills | Status: DC

## 2018-01-15 NOTE — Progress Notes (Signed)
Provided and reviewed discharge paperwork. Utilized [employer] video interpreter, Tracy HesselbachMaria [phone]. Verified understanding by use of teach back method. Pt also verbalized understanding. Pt to make her own follow up appointment due to no answer with [facility] by this nurse x 2. To be discharged with infant home.

## 2018-01-15 NOTE — Discharge Summary (Signed)
Obstetric Discharge Summary  Patient ID: Tracy Mata MRN: [id number] DOB/AGE: 1991/03/10 27 y.o.   Date of Admission: 01/13/2018  Date of Discharge:  01/15/18  Admitting Diagnosis: Onset of Labor at 9836w0d  Secondary Diagnosis: History of preterm premature rupture of membranes, History of preterm birth  Mode of Delivery: Normal spontaneous vaginal delivery     Discharge Diagnosis: No other diagnosis   Intrapartum Procedures: Atificial rupture of membranes and epidural   Post partum procedures: None  Complications: None   Brief Hospital Course   Tracy Mata is a [id number] who had a SVD on 01/14/2018;  for further details of this birth, please refer to the delivey note.  Patient had an uncomplicated postpartum course.  By time of discharge on PPD#1, her pain was controlled on oral pain medications; she had appropriate lochia and was ambulating, voiding without difficulty and tolerating regular diet.  She was deemed stable for discharge to home.    Labs: CBC Latest Ref Rng & Units 01/15/2018 01/14/2018 10/24/2017  WBC 3.6 - 11.0 K/uL 8.1 7.4 8.1  Hemoglobin 12.0 - 16.0 g/dL 14.712.4 82.912.8 56.211.3  Hematocrit 35.0 - 47.0 % 35.3 36.4 34.3  Platelets 150 - 440 K/uL 131(L) 167 198   O POS  Physical exam:   Temp:  [97.5 F (36.4 C)-99 F (37.2 C)] 97.5 F (36.4 C) (09/23 0734) Pulse Rate:  [74-87] 87 (09/23 0000) Resp:  [18-20] 18 (09/23 0734) BP: (107-116)/(68-85) 107/77 (09/23 0734) SpO2:  [97 %-98 %] 97 % (09/23 0000)  General: alert and no distress  Lochia: appropriate  Abdomen: soft, NT  Uterine Fundus: firm  Extremities: no evidence of DVT seen on physical exam  Discharge Instructions: Per After Visit Summary.  Activity: Advance as tolerated. Pelvic rest for 6 weeks.  Also refer to After Visit Summary  Diet: Regular  Medications: Allergies as of 01/15/2018   No Known Allergies     Medication List    TAKE these medications   ibuprofen 600 MG  tablet Commonly known as:  ADVIL,MOTRIN Take 1 tablet (600 mg total) by mouth every 6 (six) hours.   prenatal multivitamin Tabs tablet Take 1 tablet by mouth daily at 12 noon.      Outpatient follow up:  Follow-up Information    Gunnar BullaLawhorn, Wandell Scullion Michelle, CNM Follow up.   Specialties:  Certified Nurse Midwife, Obstetrics and Gynecology, Radiology Contact information: [address] [place] [address] [phone]          Postpartum contraception: abstinence  Discharged Condition: stable  Discharged to: home   Newborn Data:  Disposition:home with mother  Apgars: APGAR (1 MIN): 7   APGAR (5 MINS): 9    Baby Feeding: Breast   Gunnar BullaJenkins Michelle Vlad Mayberry, CNM [facility], [facility] 01/15/18 1:32 PM

## 2018-01-15 NOTE — Anesthesia Postprocedure Evaluation (Signed)
Anesthesia Post Note  Patient: Tracy Mata  Procedure(s) Performed: AN AD HOC LABOR EPIDURAL  Patient location during evaluation: Mother Baby Anesthesia Type: Epidural Level of consciousness: awake and alert Pain management: pain level controlled Vital Signs Assessment: post-procedure vital signs reviewed and stable Respiratory status: spontaneous breathing, nonlabored ventilation and respiratory function stable Cardiovascular status: stable Postop Assessment: no headache, no backache and epidural receding Anesthetic complications: no     Last Vitals:  Vitals:   01/15/18 0500 01/15/18 0734  BP:  107/77  Pulse:    Resp:  18  Temp: 36.7 C (!) 36.4 C  SpO2:      Last Pain:  Vitals:   01/15/18 0734  TempSrc: Oral  PainSc:                  [name],  [name] A

## 2018-01-15 NOTE — Discharge Instructions (Signed)
Lactancia materna Breastfeeding Decidir Museum/gallery exhibitions officer es una de las mejores elecciones que puede hacer por usted y su beb. Un cambio en las hormonas durante el embarazo hace que las mamas produzcan leche materna en las glndulas productoras de [place]. Las hormonas impiden que la leche materna sea liberada antes del nacimiento del beb. Adems, impulsan el flujo de leche luego del nacimiento. Una vez que ha comenzado a Museum/gallery exhibitions officer, Conservation officer, nature beb, as Immunologist succin o Theatre manager, pueden estimular la liberacin de [place] de las glndulas productoras de [place]. Los beneficios de Smith International investigaciones demuestran que la lactancia materna ofrece muchos beneficios de salud para bebs y [place]. Adems, ofrece una forma gratuita y conveniente de Corporate treasurer al beb. Para el beb  La primera leche (calostro) ayuda a Careers information officer funcionamiento del aparato digestivo del beb.  Las clulas especiales de la leche (anticuerpos) ayudan a Artist las infecciones en el beb.  Los bebs que se alimentan con leche materna tambin tienen menos probabilidades de tener asma, alergias, obesidad o diabetes de tipo 2. Adems, tienen menor riesgo de sufrir el sndrome de muerte sbita del lactante (SMSL).  Los nutrientes de la [place] materna son mejores para Patent examiner las necesidades del beb en comparacin con la CHS Inc.  La leche materna mejora el desarrollo cerebral del beb. Para usted  La lactancia materna favorece el desarrollo de un vnculo muy especial entre la madre y el beb.  Es conveniente. La leche materna es econmica y siempre est disponible a la Human resources officer.  La lactancia materna ayuda a quemar caloras. Claude Manges a perder el peso ganado durante el [place].  Hace que el tero vuelva al tamao que tena antes del embarazo ms rpido. Adems, disminuye el sangrado (loquios) despus del parto.  La lactancia materna contribuye a reducir Nurse, adult de tener diabetes de tipo 2,  osteoporosis, artritis reumatoide, enfermedades cardiovasculares y cncer de mama, ovario, tero y endometrio en el futuro. Informacin bsica sobre la lactancia Comienzo de la lactancia  Encuentre un lugar cmodo para sentarse o Teacher, music, con un buen respaldo para el cuello y la espalda.  Coloque una almohada o una manta enrollada debajo del beb para acomodarlo a la altura de la mama (si est sentada). Las almohadas para Museum/gallery exhibitions officer se han diseado especialmente a fin de servir de apoyo para los brazos y el beb [employer] Foods.  Asegrese de que la barriga del beb (abdomen) est frente a la suya.  Masajee suavemente la mama. Con las yemas de los dedos, Liberty Media bordes exteriores de la mama hacia adentro, en direccin al pezn. Esto estimula el flujo de [place]. Si la [employer]ot, es posible que deba Educational psychologist con este movimiento durante la Market researcher.  Sostenga la mama con 4 dedos por debajo y Multimedia programmer por arriba del pezn (forme la letra C con la mano). Asegrese de que los dedos se encuentren lejos del pezn y de la boca del beb.  Empuje suavemente los labios del beb con el pezn o con el dedo.  Cuando la boca del beb se abra lo suficiente, acrquelo rpidamente a la mama e introduzca todo el pezn y la arola, tanto como sea posible, dentro de la boca del beb. La arola es la zona de color que rodea al pezn. ? Debe haber ms arola visible por arriba del labio superior del beb que por debajo del labio inferior. ? Los labios del beb deben estar abiertos y extendidos hacia afuera (evertidos) para asegurar que el  beb se prenda de forma Svalbard & Jan Mayen Islandsadecuada y cmoda. ? La lengua del beb debe estar entre la enca inferior y Educational psychologistla mama.  Asegrese de que la boca del beb est en la posicin correcta alrededor del pezn (prendido). Los labios del beb deben crear un sello sobre la mama y estar doblados hacia afuera (invertidos).  Es comn que el beb succione durante 2 a 3 minutos  para que comience el flujo de St. Hedwigleche materna. Cmo debe prenderse Es muy importante que le ensee al beb cmo prenderse adecuadamente a la mama. Si el beb no se prende adecuadamente, puede causar Federated Department Storesdolor en los pezones, reducir la produccin de [place] Cityleche materna y Radio producerhacer que el beb tenga un escaso aumento de Neosho Fallspeso. Adems, si el beb no se prende adecuadamente al pezn, puede tragar aire durante la alimentacin. Esto puede causarle molestias al beb. Hacer eructar al beb al Pilar Platecambiar de mama puede ayudarlo a liberar el aire. Sin embargo, ensearle al beb cmo prenderse a la mama adecuadamente es la mejor manera de evitar que se sienta molesto por tragar Oceanographeraire mientras se alimenta. Signos de que el beb se ha prendido adecuadamente al pezn  Tironea o succiona de modo silencioso, sin Publishing rights managercausarle dolor. Los labios del beb deben estar extendidos hacia afuera (evertidos).  Se escucha que traga cada 3 o 4 succiones una vez que la WPS Resourcesleche ha comenzado a Radiographer, therapeuticfluir (despus de que se produzca el reflejo de eyeccin de la Raylandleche).  Hay movimientos musculares por arriba y por delante de sus odos al Printmakersuccionar.  Signos de que el beb no se ha prendido Audiological scientistadecuadamente al pezn  Hace ruidos de succin o de chasquido mientras se Tree surgeonalimenta.  Siente dolor en los pezones.  Si cree que el beb no se prendi correctamente, deslice el dedo en la comisura de la boca y Ameren Corporationcolquelo entre las encas del beb para interrumpir la succin. Intente volver a comenzar a Museum/gallery exhibitions officeramamantar. Signos de Market researcherlactancia materna exitosa Signos del beb  El beb disminuir gradualmente el nmero de succiones o dejar de succionar por completo.  El beb se quedar dormido.  El cuerpo del beb se relajar.  El beb retendr Neomia Dearuna pequea cantidad de Kindred Healthcareleche en la boca.  El beb se desprender solo del Greenepecho.  Signos que presenta usted  Las mamas han aumentado la firmeza, el peso y el tamao 1 a 3 horas despus de Museum/gallery exhibitions officeramamantar.  Estn ms blandas inmediatamente  despus de amamantar.  Se producen un aumento del volumen de Azerbaijanleche y un cambio en su consistencia y color hacia el quinto da de Market researcherlactancia.  Los pezones no duelen, no estn agrietados ni sangran.  Signos de que su beb recibe la cantidad de leche suficiente  Mojar por lo menos 1 o 2paales durante las primeras 24horas despus del nacimiento.  Mojar por lo menos 5 o 6paales cada 24horas durante la primera semana despus del nacimiento. La orina debe ser clara o de color amarillo plido a los 5das de vida.  Mojar entre 6 y 8paales cada 24horas a medida que el beb sigue creciendo y desarrollndose.  Defeca por lo menos 3 veces en 24 horas a los [address]  [address] de [address]. Las heces deben ser blandas y Armed forces operational officeramarillentas.  Defeca por lo menos 3 veces en 24 horas a los [address] das de [address]. Las heces deben ser grumosas y Armed forces operational officeramarillentas.  No registra una prdida de peso mayor al 10% del peso al nacer durante los primeros 3 [address]  [address]das de [place]vida.  Aumenta de peso un promedio  de 4 a 7onzas (113 a 198g) por semana despus de los 4 [address]  [address]das de vida.  Aumenta de Lake Tapawingopeso, Hollowayvillediariamente, de Bisonmanera uniforme a Glass blower/designerpartir de los 5 [address]  [address] de vida, sin Passenger transport managerregistrar prdida de peso despus de las 2semanas de vida. Despus de alimentarse, es posible que el beb regurgite una pequea cantidad de Dravosburgleche. Esto es normal. Frecuencia y duracin de la lactancia El amamantamiento frecuente la ayudar a producir ms Azerbaijanleche y puede prevenir dolores en los pezones y las mamas extremadamente llenas (congestin Libertymamaria). Alimente al beb cuando muestre signos de hambre o si siente la necesidad de reducir la congestin de las Maconmamas. Esto se denomina "lactancia a demanda". Las seales de que el beb tiene hambre incluyen las siguientes:  Aumento del Avra Valleyestado de Clementonalerta, Saint Vincent and the Grenadinesactividad o inquietud.  Mueve la cabeza de un lado a otro.  Abre la boca cuando se le toca la mejilla o la comisura de la boca (reflejo de bsqueda).  Aumenta las vocalizaciones, tales como  sonidos de succin, se relame los labios, emite arrullos, suspiros o chirridos.  Mueve la Jones Apparel Groupmano hacia la boca y se chupa los dedos o las manos.  Est molesto o llora.  Evite el uso del chupete en las primeras 4 a 6 semanas despus del nacimiento del beb. Despus de este perodo, podr usar un chupete. Las investigaciones demostraron que el uso del chupete durante Financial risk analystel primer ao de vida del beb disminuye el riesgo de tener el sndrome de muerte sbita del lactante (SMSL). Permita que el nio se alimente en cada mama todo lo que desee. Cuando el beb se desprende o se queda dormido mientras se est alimentando de la primera mama, ofrzcale la segunda. Debido a que, con frecuencia, los recin nacidos estn somnolientos las primeras semanas de vida, es posible que deba despertar al beb para alimentarlo. Los horarios de Acupuncturistlactancia varan de un beb a otro. Sin embargo, las siguientes reglas pueden servir como gua para ayudarla a Lawyergarantizar que el beb se alimenta adecuadamente:  Se puede amamantar a los recin nacidos (bebs de 4 semanas o menos de vida) cada 1 a 3 horas.  No deben transcurrir ms de 3 horas durante el da o 5 horas durante la noche sin que se amamante a los recin nacidos.  Debe amamantar al beb un mnimo de 8 veces en un perodo de 24 horas.  Extraccin de Bank of Americaleche materna La extraccin y Contractorel almacenamiento de la leche materna le permiten asegurarse de que el beb se alimente exclusivamente de su leche materna, aun en momentos en los que no puede Museum/gallery exhibitions officeramamantar. Esto tiene especial importancia si debe regresar al Aleen Campitrabajo en el perodo en que an est amamantando o si no puede estar presente en los momentos en que el beb debe alimentarse. Su asesor en lactancia puede ayudarla a Clinical research associateencontrar un mtodo de extraccin que funcione mejor para usted y Programmer, systemsorientarla sobre cunto tiempo es seguro almacenar Munnsvilleleche materna. Cmo cuidar las mamas durante la lactancia Los pezones pueden secarse, Lobbyistagrietarse y  doler durante la Market researcherlactancia. Las siguientes recomendaciones pueden ayudarla a Pharmacologistmantener las TEPPCO Partnersmamas humectadas y sanas:  Careers information officervite usar jabn en los pezones.  Use un sostn de soporte diseado especialmente para la lactancia materna. Evite usar sostenes con aro o sostenes muy ajustados (sostenes deportivos).  Seque al aire sus pezones durante 3 a 4minutos despus de amamantar al beb.  Utilice solo apsitos de Haematologistalgodn en el sostn para Environmental health practitionerabsorber las prdidas de Paulinaleche. La prdida de un poco de Deere & Companyleche materna entre  las tomas es normal.  Utilice lanolina sobre los pezones luego de Museum/gallery exhibitions officer. La lanolina ayuda a mantener la humedad normal de la piel. La lanolina pura no es perjudicial (no es txica) para el beb. Adems, puede extraer Beazer Homes algunas gotas de [place] materna y Engineer, maintenance (IT) suavemente esa ToysRus pezones para que la [place] se seque al aire.  Durante las primeras semanas despus del nacimiento, algunas mujeres experimentan [place]. La congestin [employer] Corporation puede hacer que sienta las mamas pesadas, calientes y sensibles al tacto. El pico de la congestin mamaria ocurre en el plazo de los 3 a 5 das despus del [place]. Las siguientes recomendaciones pueden ayudarla a Paramedic la congestin mamaria:  Vace por completo las mamas al QUALCOMM o Environmental health practitioner. Puede aplicar calor hmedo en las mamas (en la ducha o con toallas hmedas para manos) antes de Museum/gallery exhibitions officer o extraer WPS Resources. Esto aumenta la circulacin y [place] a que la [place]. Si el beb no vaca por completo las [address] cuando lo [address], extraiga la [place] restante despus de que haya finalizado.  Aplique compresas de hielo Yahoo! Inc inmediatamente despus de Museum/gallery exhibitions officer o extraer [place], a menos que le resulte demasiado incmodo. Haga lo siguiente: ? Ponga el hielo en una bolsa plstica. ? Coloque una FirstEnergy Corp piel y la bolsa de hielo. ? Coloque el hielo durante , 2 o 3veces por da.  Asegrese de que el  beb est prendido y se encuentre en la posicin correcta mientras lo alimenta.  Si la congestin mamaria persiste luego de 48 horas o despus de seguir estas recomendaciones, comunquese con su mdico o un Holiday representative. Recomendaciones de salud general durante la lactancia  Consuma 3 comidas y 3 colaciones saludables todos los [place]. Las M.D.C. Holdings bien alimentadas que amamantan necesitan entre 450 y 500 caloras adicionales por Futures trader. Puede cumplir con este requisito al aumentar la cantidad de una dieta equilibrada que realice.  Beba suficiente agua para mantener la orina clara o de color amarillo plido.  Descanse con frecuencia, reljese y siga tomando sus vitaminas prenatales para prevenir la fatiga, el estrs y los niveles bajos de vitaminas y [employer] en el cuerpo (deficiencias de nutrientes).  No consuma ningn producto que contenga nicotina o tabaco, como cigarrillos y Administrator, Civil Service. El beb puede verse afectado por las sustancias qumicas de los cigarrillos que pasan a la [place] materna y por la exposicin al humo ambiental del tabaco. Si necesita ayuda para dejar de fumar, consulte al mdico.  Evite el consumo de alcohol.  No consuma drogas ilegales o marihuana.  Antes de Dietitian, hable con el mdico. Estos incluyen medicamentos recetados y de [place], como tambin vitaminas y suplementos a base de hierbas. Algunos medicamentos, que pueden ser perjudiciales para el beb, pueden pasar a travs de la Colgate Palmolive.  Puede quedar embarazada durante la lactancia. Si se desea un mtodo anticonceptivo, consulte al mdico sobre cules son las opciones seguras durante la Market researcher. Dnde encontrar ms informacin: Liga internacional La Leche: [email or web address] Comunquese con un mdico si:  Siente que quiere dejar de Museum/gallery exhibitions officer o se siente frustrada con la lactancia.  Sus pezones estn agrietados o Water quality scientist.  Sus mamas estn irritadas, sensibles o  calientes.  Tiene los siguientes sntomas: ? Dolor en las mamas o en los pezones. ? Un rea hinchada en cualquiera de las mamas. ? Grant Ruts o escalofros. ? Nuseas o vmitos. ? Drenaje de otro lquido distinto de la WPS Resources materna desde los pezones.  Sus  mamas no se llenan antes de amamantar al beb para el quinto da despus del Edison.  Se siente triste y deprimida.  El beb: ? Est demasiado somnoliento como para comer bien. ? Tiene problemas para dormir. ? Tiene ms de 1 semana de vida y HCA Inc de 6 paales en un periodo de 24 horas. ? No ha aumentado de [place]h a los [address] de [address]  El beb defeca menos de 3 veces en 24 horas.  La piel del beb o las partes blancas de los ojos se vuelven amarillentas. Solicite ayuda de inmediato si:  El beb est muy cansado Retail buyer) y no se quiere despertar para comer.  Le sube la fiebre sin causa. Resumen  La lactancia materna ofrece muchos beneficios de salud para bebs y [place].  Intente amamantar a su beb cuando muestre signos tempranos de hambre.  Haga cosquillas o empuje suavemente los labios del beb con el dedo o el pezn para lograr que el beb abra la boca. Acerque el beb a la mama. Asegrese de que la mayor parte de la arola se encuentre dentro de la boca del beb. Ofrzcale una mama y haga eructar al beb antes de pasar a la otra.  Hable con su mdico o asesor en lactancia si tiene dudas o problemas con la lactancia. Esta informacin no tiene Theme park manager el consejo del mdico. Asegrese de hacerle al mdico cualquier pregunta que tenga. Document Released: 04/11/2005 Document Revised: 08/01/2016 Document Reviewed: 08/01/2016 Elsevier Interactive Patient Education  2018 ArvinMeritor. Desafos y soluciones para el amamantamiento (Breastfeeding Challenges and Solutions) Aunque el amamantamiento es un proceso natural, puede presentar desafos, en especial durante las primeras semanas despus del nacimiento del beb.  Al comenzar a amamantar al nuevo beb, es normal que surjan problemas, incluso si ha Hewlett-Packard. En este documento, se indican algunas soluciones para los desafos ms frecuentes del amamantamiento. DESAFOS Y SOLUCIONES Desafo: pezones irritados o agrietados Es frecuente que las madres que [address] tengan los pezones irritados o Chief Strategy Officer. Los pezones agrietados o irritados a menudo se deben a que la boca del beb se prende de forma inadecuada para Museum/gallery exhibitions officer. Tambin puede haber irritacin si el beb no est ubicado como corresponde [employer]. Si bien es frecuente DIRECTV pezones irritados y agrietados durante la primera semana posterior al nacimiento, el dolor en los pezones no es normal nunca. Si tiene los pezones irritados o agrietados durante ms de [place], o si tiene Federated Department Stores, llame a su mdico o al Holiday representative. Solucin Siga los siguientes pasos para asegurarse de que el beb se prenda y est ubicado como corresponde:  Encuentre un lugar cmodo para sentarse o Teacher, music, con un buen respaldo para el cuello y la espalda.  Coloque una almohada o una manta enrollada debajo del beb para acomodarlo a la altura de la mama (si est sentada).  Asegrese de que el abdomen del beb est frente al suyo.  Masajee suavemente la mama. Con las yemas de los dedos, masajee la pared del pecho hacia el pezn en un movimiento circular. Esto estimula el flujo de [place]. Es posible que Engineer, manufacturing systems este movimiento mientras amamanta si la leche fluye lentamente.  Sostenga la mama con el pulgar por arriba del pezn y los otros 4 dedos por debajo de la mama. Asegrese de que los dedos se encuentren lejos del pezn y de la boca del beb.  Empuje suavemente los labios del beb con el pezn o con  el dedo.  Cuando la boca del beb se abra lo suficiente, acrquelo rpidamente a la mama e introduzca todo el pezn y la zona oscura que lo rodea (areola), tanto como sea posible, dentro de  la boca del beb. ? Debe haber ms areola visible por arriba del labio superior del beb que por debajo del labio inferior. ? La lengua del beb debe estar entre la enca inferior y la Fort Laramiemama.  Asegrese de que la boca del beb est en la posicin correcta alrededor del pezn (prendida). Los labios del beb deben crear un sello sobre la mama, doblndose hacia afuera (invertidos).  Es comn que el beb succione durante 2 a 3 minutos para que comience el flujo de [place] Groveleche materna. Entre los signos de que el beb se ha prendido Audiological scientistadecuadamente al pezn, se incluyen:  El beb tironea o succiona con tranquilidad, sin causarle dolor.  Se escucha que traga cada 3 o 4 succiones.  Hace movimientos musculares por arriba y por delante de sus odos al Printmakersuccionar. Entre los signos de que el beb no se ha prendido Audiological scientistadecuadamente al pezn, se incluyen:  Ruidos de succin o de chasquido Patent attorneymientras amamanta.  Dolor en el pezn. Para asegurarse de Tesoro Corporationtener los pezones humectados y sanos, haga lo siguiente:  No se lave los pezones con Coaldalejabn.  Use un sostn de soporte. Evite usar sostenes con aro o sostenes ajustados.  Deje secar al aire los pezones durante 3 a 4minutos despus de amamantar al beb.  Utilice solamente almohadillas de algodn en el sostn para absorber las prdidas de Kapaauleche. La prdida de un poco de Deere & Companyleche materna entre las tomas es normal. Asegrese de Multimedia programmercambiar las almohadillas si se empapan con [place]villeleche.  Colquese lanolina United Stationerssobre los pezones despus de Museum/gallery exhibitions officeramamantar. La lanolina ayuda a mantener la humedad normal de la piel. Si Botswanausa lanolina pura, no tiene que lavarse los pezones antes de Corporate treasureralimentar al beb. La lanolina pura no es txica para el beb. Tambin puede extraer Teachers Insurance and Annuity Associationmanualmente algunas gotas de Braceyleche materna, Floridamasajearla con suavidad sobre los pezones y dejar que se seque al aire. Desafo: congestin mamaria La Gap Inccongestin mamaria se produce cuando las [address] D[address]amas se llenan excesivamente de French Islandleche. Las primeras  semanas despus del nacimiento, usted puede tener congestin Parolemamaria. La congestin International Business Machinesmamaria puede hacer que las mamas latan y estn duras, muy tirantes, calientes y sensibles. La congestin llega a un pico mximo alrededor del quinto da despus del nacimiento. El hecho de tener congestin mamaria no significa que deba dejar de Museum/gallery exhibitions officeramamantar al beb. Solucin  Alimente al beb cuando muestre signos de hambre o si siente la necesidad de reducir la congestin de las North Englishmamas. Esto se denomina "lactancia a demanda".  Los recin nacidos (bebs de menos de 4semanas de vida) a menudo se alimentan cada 1 a 3 horas Administratordurante el da. Es posible que deba despertar al beb si est durmiendo cuando es hora de Loopamamantarlo.  No deje que el beb duerma ms de 5horas durante la noche sin amamantarlo.  Extraiga leche de forma manual o con un sacaleche antes de amamantar para ablandar la mama, la areola y el pezn.  Aplique calor hmedo (en la ducha o con toallas de mano humedecidas con agua tibia) justo antes de amamantar o de extraer Del Aireleche, o masajee la mama antes de amamantar o mientras lo hace. Esto aumenta la circulacin y Saint Vincent and the Grenadinesayuda a que la Whitesboroleche fluya.  Vace por completo las mamas al QUALCOMMamamantar o extraer La Bargeleche. Despus, use un sostn tipo deportivo (  para amamantar o comn) o una camiseta sin mangas durante 1 o 2 das para indicarle al cuerpo que disminuya ligeramente la produccin de [place]. Use solamente un sostn tipo deportivo o una camiseta sin mangas para tratar la congestin. Las M.D.C. Holdings que amamantan habitualmente deben evitar los sostenes ajustados. Una vez que la congestin se [place], vuelva a usar ropa [place], [place].  Aplquese compresas de hielo en las mamas para reducir Chief Technology Officer de la congestin y Technical sales engineer hinchazn, excepto si el hielo le resulta incmodo.  No retrase los horarios de amamantamiento. Intente relajarse cuando sea la hora de alimentar al beb. Esto ayuda a Licensed conveyancer "reflejo de bajada", que  hace que la [place] comience a salir de Educational psychologist.  Asegrese de que el beb est bien prendido a la mama y que se encuentre en la posicin correcta mientras lo [place].  Deje que el beb permanezca en la mama todo el tiempo que est prendido bien y que succione activamente. El beb le har saber que ha terminado de alimentarse cuando se desprenda de la mama o se quede dormido.  Evite darle biberones o chupetes al beb en las primeras semanas de amamantamiento. Para introducir estos elementos, espere hasta despus de resolver cualquier desafo con el amamantamiento.  Si regresa al Aleen Campi o est fuera de su casa durante un perodo prolongado, trate de Marine scientist en el mismo horario que debera amamantar al beb.  Tome mucho lquido para Statistician, que con el tiempo puede aumentar el riesgo de [place]. Si sigue estas indicaciones, la congestin debe mejorar en 24 a 48 horas. Si an tiene dificultades, llame a su mdico o al Holiday representative. Desafo: conductos galactforos obstruidos Los conductos galactforos se obstruyen cuando no drenan la leche como corresponde y, en consecuencia, se hinchan. El uso de un sostn para Museum/gallery exhibitions officer ajustado o las dificultades para que el beb se prenda pueden causar la obstruccin de estos conductos. El hecho de no tomar la cantidad suficiente de agua (de 8 a 10vasos [1,9 a 2,4l] por Futures trader) puede contribuir a que los conductos galactforos se obstruyan. Una vez que un conducto se obstruye, puede tener bultos duros, irritacin y Copy. Solucin No retrase los horarios de Librarian, academic. Alimente a su beb con frecuencia y trate de vaciar sus pechos cada vez que lo amamanta. Trate de alimentarlo primero del lado afectado ya que habr ms probabilidad de que se vace por completo ese pecho. Aplique toallas hmedas calientes durante 5 a antes de amamantar. De forma alternativa, una ducha caliente justo antes de  Museum/gallery exhibitions officer puede ofrecer el calor hmedo que estimula el flujo de [place]. Tambin puede ser de [employer] suavemente la zona irritada antes de Museum/gallery exhibitions officer y [place] est [place]. Evite usar ropa [place] o sostenes que presionen las [place]. Use sostenes que sostengan bien las [place], pero evite usar sostenes con arco. Si se le obstruye un conducto galactfero y tiene [place], debe ver a su mdico. Desafo: mastitis La mastitis es la inflamacin de la mama. Por lo general, est causada por una infeccin bacteriana y puede provocar sntomas similares a los de Emergency planning/management officer. Puede tener enrojecimiento de la mama y [place]. A menudo, cuando hay una mastitis, la mama est dura y caliente, y duele mucho. Las causas ms frecuentes de la mastitis son la mala prendida o la succin inadecuada del beb, presin constante en la mama (posiblemente por usar un sostn ajustado o una camisa que limite el flujo de  leche), estrs o fatiga anormales, o no Museum/gallery exhibitions officer con regularidad. Solucin Le recetarn un antibitico para tratar la infeccin. Sigue siendo importante que amamante con frecuencia para vaciar las [place]. Seguir [employer] se recupera de la mastitis no le har dao al beb. Asegrese de que el beb est bien ubicado cada vez que lo [place]. Aplique calor hmedo en las mamas durante unos minutos antes de amamantar para ayudar al flujo de la [place] y a que las mamas se vacen ms fcilmente. Desafo: candidiasis La candidiasis es una infeccin por hongos que se forma en los pezones, en la mama o en la boca del beb. Causa picazn, dolor, ardor o dolor punzante, y a veces una erupcin. Solucin Le recetarn un ungento para los [place], y su beb recibir un medicamento lquido para Government social research officer. Es importante que usted y el beb reciban tratamiento de forma simultnea, porque pueden transmitirse la candidiasis entre ustedes. Cambie los apsitos mamarios desechables con frecuencia. Cada da, debe lavar con agua muy  caliente cualquier sostn, toalla o ropa que entre en contacto con las reas infectadas de su cuerpo o del cuerpo del beb. Lvese las manos y lave las manos del beb con frecuencia. Hierva durante 20 minutos, una vez al da, todos los chupetes, las tetinas de los biberones y los juguetes que el beb se lleve a la boca. Despus de 1 semana de tratamiento, deseche los chupetes y las tetinas de los biberones y compre unos nuevos. Debe hervir durante 20 minutos, todos los [place], todos los componentes del sacaleche que estn en contacto con la [place]. Desafo: poca produccin de [employer] beb no aumenta de peso como corresponde, es posible que usted no est produciendo [employer]. La produccin de Col[employer] Palmolive est basada en un sistema de oferta y [place]. La produccin de [employer] Resources depende de la frecuencia y la eficacia con la que el beb vaca la mama. Solucin Cuanto ms amamante y se extraiga [place], ms Comcast. Es importante que el beb vace al menos una de las mamas en cada alimentacin. Si no es as, use un sacaleche o extraiga la leche restante de forma manual. Esto ayudar a drenar la mayor cantidad de [place] posible cada vez que amamanta. Tambin ser Merrill Lynch para que el cuerpo produzca ms [place]. Si el beb no le est vaciando las [address], esto puede deberse a problemas de prendida, succin o ubicacin. Si sigue teniendo poca leche despus de [employer] Standard Companies, comunquese con su mdico o con un especialista en lactancia lo antes posible. Desafo: pezones invertidos o planos En algunas mujeres, los pezones estn metidos hacia adentro en vez de sobresalir. Otras mujeres tiene pezones planos. Los pezones invertidos o planos a veces pueden hacer que el beb tenga ms dificultades para prenderse a la mama. Solucin Le darn un pequeo dispositivo que saca hacia afuera los pezones invertidos. Este dispositivo debe colocarse inmediatamente antes de poner al beb en la mama. Tambin puede  tratar de usar Oceanographer durante un corto tiempo antes de colocar al beb en la mama. El sacaleche puede sacar el pezn hacia afuera para ayudar a su beb a que se agarre con mayor facilidad. Tambin la succin del beb ayudar a que el pezn invertido sobresalga. Si tiene los pezones planos, estimule al beb para que se prenda a la mama y Technical sales engineer con frecuencia los primeros das despus del nacimiento. Esto le dar la prctica para que se prenda correctamente mientras la mama est blanda. Cuando la produccin  de [employer] segundo y el quinto da despus del parto y las mamas se llenen, el beb se prender ms fcilmente. Si sigue teniendo inquietudes, comunquese con Press photographer, quien podr ensearle tcnicas adicionales para resolver los problemas de amamantamiento relacionados con la posicin y la forma del pezn. PARA OBTENER MS INFORMACIN Liga internacional [place]: [email or web address] Esta informacin no tiene como fin reemplazar el consejo del mdico. Asegrese de hacerle al mdico cualquier pregunta que tenga. Document Released: 09/28/2007 Document Revised: 05/02/2014 Document Reviewed: 10/05/2012 Elsevier Interactive Patient Education  2017 [employer]. Parto vaginal, cuidados de puerperio (Postpartum Care After Vaginal Delivery) El perodo de tiempo que sigue inmediatamente al parto se conoce como puerperio. QU TIPO DE ATENCIN MDICA RECIBIR?  Podra continuar recibiendo medicamentos y lquidos travs de una va intravenosa (IV) que se Scientific laboratory technician en una de sus venas.  Si se le realiz una incisin cerca de la vagina (episiotoma) o si ha tenido Airline pilot parto, podran indicarle que se coloque compresas fras sobre la episiotoma o Art therapist. Esto ayuda a Engineer, materials y la hinchazn.  Es posible que le den una botella rociadora para que use cuando vaya al bao. Puede utilizarla hasta que se sienta cmoda limpindose de la manera habitual.  Siga los pasos a continuacin para usar la botella rociadora: ? Antes de orinar, llene la botella rociadora con agua tibia. No use agua caliente. ? Despus de Geographical information systems officer, [place] an est sentada en el inodoro, use la botella rociadora para enjuagar el rea alrededor de la uretra y la abertura vaginal. Con esto podr limpiar cualquier rastro de orina y [place]. ? Puede hacer esto en lugar de secarse. Cuando comience a Barrister's clerk, podr usar la botella rociadora antes de secarse. Asegrese de secarse suavemente. ? Llene la botella rociadora con agua limpia cada vez que vaya al bao.  Deber usar apsitos sanitarios. CMO PUEDO SENTIRME?  Quizs no tenga necesidad de orinar durante varias horas despus del parto.  Sentir algo de dolor y Associate Professor en el abdomen y la vagina.  Si est amamantando, podra tener contracciones uterinas cada vez que lo haga. Estas podran prolongarse hasta varias semanas durante el puerperio. Las contracciones uterinas ayudan al tero a Hotel manager a su tamao habitual.  Es normal tener un poco de hemorragia vaginal (loquios) despus del [place]. La cantidad y apariencia de los loquios a menudo es similar a las del perodo menstrual la primera semana despus del [place]. Disminuir gradualmente las siguientes semanas hasta convertirse en una descarga seca amarronada o [place]. En la Lennar Corporation, los loquios se detienen Guardian Life Insurance 6 a 8semanas despus del [place]. Los sangrados vaginales pueden variar de mujer a Nurse, learning disability.  Los primeros [address] despus del parto, podra padecer [place]. Los pechos se sentirn pesados, llenos y molestos. Las mamas tambin podran latir y ponerse duras, muy tirantes, calientes y sensibles al tacto. Cuando esto [place], podra notar Mirant se escapa de los senos.El mdico puede recomendarle algunos mtodos para Emergency planning/management officer causado por la [place]. La congestin mamaria debera desaparecer al cabo de So[employer]ron Corporation.  Podra sentirse ms deprimida o preocupada que lo habitual debido a los cambios hormonales luego del [place]. Estos sentimientos no deben durar ms de [employer] Supply. Si no desaparecen al cabo de Time Warner, hable con su mdico. QU CUIDADOS DEBO TENER?  Infrmele a su mdico si siente dolor o malestar.  Beba suficiente agua para mantener la Versie Starks  o de color amarillo plido.  Lvese bien las manos con agua y jabn durante al menos 20segundos despus de cambiar el apsito sanitario, usar el bao o antes de sostener o Corporate treasurer al beb.  Si no est amamantando, evite tocarse mucho los senos. Al hacerlo, podran producir ms WPS Resources.  Si se siente dbil o mareada, o si siente que est a punto de [address], pida ayuda antes de realizar lo siguiente: ? Levantarse de la cama. ? Ducharse.  Cambie los apsitos sanitarios con frecuencia. Observe si hay cambios en el flujo, como un aumento repentino en el volumen, cambios en el color o cogulos sanguneos de gran tamao. Si expulsa un cogulo sanguneo por la vagina, gurdelo para mostrrselo a su mdico. No tire la cadena sin que el mdico examine el cogulo antes.  Asegrese de tener todas las vacunas al da. Esto la ayudar a Theme park manager protegida y a proteger al beb de determinadas enfermedades. Podra necesitar vacunas antes de dejar el hospital.  Si lo desea, hable con el mdico acerca de los mtodos de planificacin familiar o control de la natalidad (mtodos anticonceptivos). CMO PUEDO ESTABLECER LAZOS CON MI BEB? Pasar tanto tiempo como le sea posible con el beb es sumamente importante. Durante ese tiempo, usted y su beb pueden conocerse y Theme park manager. Tener al beb con usted en la habitacin le dar tiempo de conocerlo. Esto tambin puede hacerla sentir ms cmoda para atender al beb. Amamantar tambin puede ayudarla a crear lazos con el beb. CMO PUEDO PLANIFICAR MI REGRESO A CASA CON EL BEB?  Asegrese de tener  instalada una butaca en el automvil. ? La butaca debe contar con la certificacin del fabricante para asegurarse de que est instalada en forma segura. ? Asegrese de que el beb quede bien asegurado en la butaca.  Pregntele al mdico todo lo que necesite saber sobre los cuidados de su beb. Asegrese de poder comunicarse con el mdico en caso de que tenga preguntas luego de dejar el hospital. Esta informacin no tiene Theme park manager el consejo del mdico. Asegrese de hacerle al mdico cualquier pregunta que tenga. Document Released: 02/06/2007 Document Revised: 08/03/2015 Document Reviewed: 03/16/2015 Elsevier Interactive Patient Education  2018 [employer]. Depresin posparto y baby blues (Postpartum Depression and Baby Blues) El perodo del posparto comienza inmediatamente despus del nacimiento de un beb y suele ser una poca de gran felicidad y mucho entusiasmo. Tambin es tiempo de muchos cambios en la vida de [place]. Sin importar cuntos hijos tenga una madre, cada nio plantea nuevos desafos y Neomia Dear nueva dinmica a la familia. Es frecuente que haya sentimientos de entusiasmo junto con cambios confusos en el estado de nimo, las emociones y los pensamientos. Todas las madres corren riesgo de tener depresin posparto o "baby blues". Estos cambios en el estado de nimo pueden presentarse inmediatamente despus del parto o muchos meses despus del nacimiento de un nio. El baby blues o la depresin posparto pueden ser leves o graves. Adems, la depresin posparto puede desaparecer con bastante rapidez o puede ser una enfermedad de larga evolucin. CAUSAS Se cree que el aumento de las concentraciones hormonales y su rpida disminucin es la causa principal de la depresin posparto y el baby blues. Durante y despus del [place], un nmero de hormonas [place]. El estrgeno y la progesterona generalmente disminuyen inmediatamente despus del parto. Tambin disminuyen con Exelon Corporation de la hormona tiroidea y de varios esteroides del cortisol. Otros factores que juegan un papel en estos cambios  anmicos incluyen los sucesos importantes de la vida y Designer, industrial/product. FACTORES DE RIESGO Si tiene alguno de los siguientes riesgos de desarrollar baby blues o depresin posparto, sepa a qu sntomas debe estar atenta durante el perodo del posparto. Los factores de riesgo que pueden aumentar la probabilidad de Warehouse manager baby blues o depresin posparto incluyen lo siguiente:  Antecedentes personales o familiares de depresin.  Depresin Academic librarian.  Problemas anmicos premenstruales o que guardan relacin con los anticonceptivos orales.  Mucho estrs en la vida.  Conflictos maritales.  Falta de una red de apoyo social.  Wilburt Finlay un beb con necesidades especiales.  Problemas de salud, como diabetes. SIGNOS Y SNTOMAS Los sntomas de baby blues incluyen lo siguiente:  Cambios en el estado de nimo durante lapsos breves, como pasar de la felicidad extrema a la tristeza.  Falta de concentracin.  Dificultad para dormir.  Ataques de llanto, sensibilidad emocional.  Irritabilidad.  Ansiedad. Generalmente, los sntomas de depresin posparto comienzan en el trmino del primer mes despus del parto. Estos sntomas incluyen:  Dificultad para dormir o somnolencia excesiva.  Prdida de peso notable.  Agitacin.  Sentimientos de inutilidad.  Falta de inters en las actividades o la comida. La psicosis posparto es una enfermedad muy grave que puede ser peligrosa. Afortunadamente, es poco frecuente. Si aparece alguno de los siguientes sntomas, se debe recibir atencin mdica de inmediato. Los sntomas de psicosis posparto incluyen lo siguiente:  Alucinaciones y delirios.  Comportamiento atpico o desorganizado.  Confusin o desorientacin. DIAGNSTICO El diagnstico se realiza mediante la evaluacin de los sntomas. No hay exmenes mdicos ni pruebas de  laboratorio que permitan hacer un diagnstico, pero hay varios cuestionarios que el mdico puede usar para identificar a las personas que tienen baby blues, depresin posparto o psicosis. A menudo se [place] una herramienta de deteccin sistemtica llamada [place] de depresin posnatal de [place] para diagnosticar la depresin durante el perodo del posparto. TRATAMIENTO Generalmente, el baby blues desaparece solo en el trmino de 1 o 2semanas. A menudo, todo lo que se necesita es apoyo social. Se le recomendar que duerma y descanse lo suficiente. En ocasiones, se le pueden administrar medicamentos para ayudarla a dormir. La depresin posparto requiere tratamiento porque puede durar varios meses o ms tiempo si no se la trata. El tratamiento puede incluir terapia individual o grupal, medicamentos o ambos para abordar los factores [place], fisiolgicos y psicolgicos que pueden tener influencia en la depresin. Tambin pueden recomendarse enfticamente el ejercicio regular, una alimentacin sana, el descanso y el apoyo social. La psicosis posparto es una enfermedad ms grave y requiere tratamiento inmediato. A menudo es necesaria la hospitalizacin. INSTRUCCIONES PARA EL CUIDADO EN EL HOGAR  Descanse todo lo posible. Tome una siesta cuando el beb duerme.  Haga ejercicios regularmente. Para algunas mujeres, el yoga y las caminatas son beneficiosas.  Consuma una dieta equilibrada y nutritiva.  Haga pequeas cosas que disfruta. Tome una taza de t, dese un bao de burbujas, lea su revista favorita o escuche su msica predilecta.  Evite el alcohol.  Pida ayuda con los [employer], la cocina, las compras de comida o las obligaciones diarias si lo necesita. No intente hacer todo.  Hable con personas allegadas sobre cmo se siente. Busque el apoyo de su pareja, sus familiares, sus amigos o de Rock[employer]ation primerizas.  Intente pensar positivamente. Piense en aquellas cosas por las que se  siente agradecida.  No pase mucho tiempo sola.  Johnson & Johnson solo medicamentos de venta libre o recetados,  segn las indicaciones del mdico.  Cumpla con todos los controles del postparto.  Infrmele a su mdico sobre cualquier inquietud que tenga.  SOLICITE ATENCIN MDICA SI: [place]le [place] reaccin al medicamento o problemas con este. SOLICITE ATENCIN MDICA DE INMEDIATO SI:  Tiene sentimientos suicidas.  Cree que podra lastimar al beb o a Engineer, maintenance (IT).  ASEGRESE DE QUE:  Comprende estas instrucciones.  Controlar su afeccin.  Recibir ayuda de inmediato si no mejora o si empeora.  Esta informacin no tiene Theme park manager el consejo del mdico. Asegrese de hacerle al mdico cualquier pregunta que tenga. Document Released: 09/28/2007 Document Revised: 04/16/2013 Document Reviewed: 01/21/2013 Elsevier Interactive Patient Education  2017 [employer].

## 2018-01-17 NOTE — Progress Notes (Signed)
ROB-Doing well. 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 Reviewed red flag symptoms and when to call. RTC x 1 week for ROB or sooner if needed.

## 2018-01-19 ENCOUNTER — Encounter: Payer: BLUE CROSS/BLUE SHIELD | Admitting: Certified Nurse Midwife

## 2018-01-25 ENCOUNTER — Encounter: Payer: BLUE CROSS/BLUE SHIELD | Admitting: Certified Nurse Midwife

## 2018-01-31 ENCOUNTER — Telehealth: Payer: Self-pay

## 2018-01-31 NOTE — Telephone Encounter (Signed)
Spoke with pt and her husband- asked what her return to work date will be and 03/05/18 is the date. FMLA papers faxed and confirmation received. Pt has a PPV 02/26/18.

## 2018-02-08 ENCOUNTER — Telehealth: Payer: Self-pay | Admitting: Certified Nurse Midwife

## 2018-02-08 NOTE — Telephone Encounter (Signed)
The patient called and stated that she would like to speak with a nurse or Marcelino Duster in regards to her not receiving her Breast pump in the mail. The patient has a few concerns to to her expecting it to arrive in a specific time frame and would like a call back to address the issue. Please advise.   (The patient stated that she did reach out to the company that processes the breast pumps and she was told to contact the office.)

## 2018-02-09 NOTE — Telephone Encounter (Signed)
Spoke with patient, advised if she does not hear from [facility] in 1 week to give me a call back.  Breast pump prescription faxed to [phone].

## 2018-02-09 NOTE — Telephone Encounter (Signed)
Spoke with patient, she is requesting a prescription for a breast pump. I told her I will speak to [facility] about this and send a prescription in for her.

## 2018-02-26 ENCOUNTER — Encounter: Payer: Self-pay | Admitting: Certified Nurse Midwife

## 2018-02-26 ENCOUNTER — Ambulatory Visit (INDEPENDENT_AMBULATORY_CARE_PROVIDER_SITE_OTHER): Payer: BLUE CROSS/BLUE SHIELD | Admitting: Certified Nurse Midwife

## 2018-02-26 NOTE — Progress Notes (Signed)
Subjective:    Tracy Mata is a 27 y.o. [id number] Hispanic female who presents for a postpartum visit. She is 6 weeks postpartum following a spontaneous vaginal birth at 39+0 gestational weeks. Anesthesia: epidural. I have fully reviewed the prenatal and intrapartum course.   Postpartum course has been uncomplicated. Baby's course has been uncomplicated. Baby is feeding by breast with formula supplementation. Normal menses has resumed. Bowel function is normal. Bladder function is normal.   Patient is not sexually active. Contraception method is condoms. Postpartum depression screening: negative. Score 0.  Last pap 2018 and was normal.  Denies difficulty breathing or respiratory distress, chest pain, abdominal pain, excessive vaginal bleeding, dysuria, and leg pain or swelling.   The following portions of the patient's history were reviewed and updated as appropriate: allergies, current medications, past medical history, past surgical history and problem list.  Review of Systems  Pertinent items are noted in HPI.   Objective:   BP 110/72   Pulse 77   Ht 5' (1.524 m)   Wt 159 lb 12.8 oz (72.5 kg)   LMP 02/20/2018 (Exact Date)   Breastfeeding? Yes   BMI 31.21 kg/m   General:  alert, cooperative and no distress   Breasts:  deferred, no complaints  Lungs: clear to auscultation bilaterally  Heart:  regular rate and rhythm  Abdomen: soft, nontender   Vulva: normal  Vagina: normal vagina  Cervix:  closed  Corpus: Well-involuted  Adnexa:  Non-palpable          Office Visit from 02/26/2018 in [facility]  PHQ-9 Total Score  0     Assessment:   Postpartum exam Six (6) wks s/p spontaneous vaginal birth Breastfeeding with formula supplementation Depression screening Contraception counseling   Plan:   May return to work without restrictions.   Encouraged routine heath maintenance, see AVS.   Discussed pump techinques, see AVS.   Reviewed red flag  symptoms and when to call.   Follow up in: 4 months for ANNUAL EXAM or earlier if needed.    Gunnar Bulla, CNM [facility], [facility] 02/26/18 2:12 PM

## 2018-02-26 NOTE — Patient Instructions (Addendum)
Cuidados preventivos entre os 18-39 anos, mulheres Preventive Care 18-39 Years, Female Cuidados preventivos se referem a escolhas de estilo de vida e consultas com seu mdico capazes de promover a sade e o bem-estar. O que os cuidados preventivos incluem?  Um exame fsico anual. Ele tambm  chamado check-up anual.  Exames dentais uma ou duas vezes ao ano.  Exames oftalmolgicos rotineiros. Pergunte ao seu mdico com que frequncia seus olhos devem ser examinados.  Escolhas pessoais de estilo de vida, incluindo: ? Cuidado dirio dos seus dentes e gengivas. ? Atividade fsica regular. ? Consumir uma dieta saudvel. ? Evitar o tabaco e o uso de drogas. ? Limitar o uso de lcool. ? Academic librarian. ? Tomar suplementos de vitaminas e minerais de acordo com as recomendaes do seu mdico. O que acontece durante o check-up anual? Os servios e exames preventivos realizados pelo seu mdico durante o check-up anual dependero da sua idade, sade geral, fatores de risco relacionados ao estilo de vida e histrico familiar de doenas. Aconselhamento Seu mdico tambm poder fazer perguntas sobre:  Uso de lcool.  Uso de tabaco.  Uso de drogas.  O bem-estar emocional.  O bem-estar em casa e nos relacionamentos.  A atividade sexual.  Os hbitos alimentares.  O trabalho e o ambiente de trabalho.  O mtodo anticoncepcional.  O ciclo menstrual.  O histrico de gravidez.  Exames preventivos Voc poder fazer os testes ou medies a seguir:  Suzanna Obey e [facility] (ndice de Art therapist).  Exame preventivo de diabetes. Isso  feito verificando-se o acar (glicose) no sangue depois de voc no comer por algum tempo (jejum).  Presso arterial.  Nveis de lipdeos e colesterol. Eles podero ser verificados a cada 5 anos comeando aos 20 anos de idade.  [place].  Exame de sangue para hepatite C.  Exame de sangue para hepatite B.  Exame para doenas  sexualmente transmissveis (DSTs).  Exame de preveno do cncer baseado em BRCA. Esse exame poder ser realizado caso voc tenha histrico de cncer de mama, de ovrio, das tubas uterinas ou de peritnio.  Exame plvico e de Papanicolau. Isso poder ser [employer] a cada 3 anos a [employer] 21 anos de idade. Comeando aos 30 anos de idade, esses exames podero ser feitos a cada 5 anos caso voc realize um exame de Papanicolau junto com um teste de HPV.  Discuta seus resultados, opes de tratamento e, se necessrio, a necessidade de mais exames com seu mdico. Vacinas Seu mdico poder recomendar certas vacinas, tais como:  Vacina contra gripe. Essa vacina  recomendada todos os anos.  Vacina contra ttano, difteria e coqueluche acelular (Tdap, Td). Voc poder precisar de um reforo da Td a cada 10 anos.  Vacina contra varicela (catapora). Voc poder precisar dessa vacina se ainda no tiver sido vacinado.  Vacina contra HPV. Caso tenha menos de 26 anos de idade, poder precisar de trs doses em um perodo de 6 meses.  Vacina contra sarampo, rubola e caxumba (MMR). Voc poder precisar de pelo menos uma dose da MMR. Voc tambm poder precisar de uma segunda dose.  Vacina pneumoccica 13-valente conjugada (PCV13). Voc poder precisar dessa vacina se sofrer de certos quadros clnicos e ainda no tiver [employer].  Vacina pneumoccica polissacardica (PPSV23). Voc poder precisar de uma ou duas doses caso fume cigarros ou sofra de certos quadros clnicos.  Vacina meningoccica. Uma dose  recomendada caso voc tenha 19-21 anos de idade e seja estudante universitrio de primeiro ano vivendo  em repblica ou caso apresente um de diversos quadros clnicos. Voc tambm poder precisar de doses de reforo adicionais.  Vacina contra hepatite A. Voc poder precisar dessa vacina se apresentar certos quadros clnicos ou viajar para ou trabalhar em lugares nos quais pode ser exposto  hepatite  A.  Vacina contra hepatite B. Voc poder precisar dessa vacina se apresentar certos quadros clnicos ou viajar para ou trabalhar em lugares nos quais pode ser exposto  hepatite B.  Vacina contra o Haemophilus influenzae tipo b (Hib). Voc poder precisar dessa vacina caso tenha alguns fatores de risco.  Converse com seu mdico sobre quais exames preventivos e vacinas voc precisar e com qual frequncia. Estas informaes no se destinam a substituir as recomendaes de seu mdico. No deixe de discutir quaisquer dvidas com seu mdico. Document Released: 08/16/2016 Document Revised: 08/16/2016 Elsevier Interactive Patient Education  2018 [place] para la extraccin de la leche materna con un sacaleche (Breast Pumping Tips) Si est amamantando, tal vez haya momentos en los que no pueda alimentar directamente al beb, por ejemplo, cuando regrese a trabajar o est de viaje. La extraccin con un sacaleche le permite guardar la leche materna y alimentar al beb ms tarde. Cuando empiece con las extracciones, es posible que no tenga mucha cantidad de [place]. [employer] deben comenzar a producir ms despus de [employer]. Si se extrae leche materna con un sacaleche en los horarios en los que suele alimentar al beb, es posible que pueda seguir produciendo la cantidad de [place] suficiente para alimentarlo, sin tener que darle tambin [place] maternizada. Cuanto ms a menudo realice las extracciones, ms cantidad de [employer] su cuerpo. Ojus?  Puede empezar con las extracciones inmediatamente despus de tener al beb. Pregntele al mdico qu es lo adecuado para usted y el beb.  Si regresar a Fish farm manager, empiece a extraerse [employer] con un sacaleche algunas semanas antes. Esto le da tiempo para aprender cmo [employer] y para guardar un suministro de [place].  Cuando est con el beb, alimntelo cuando el  pequeo tenga apetito. Extrigase leche materna despus de cada sesin de alimentacin.  Cuando no est con el beb durante muchas horas, extrigase [employer] unos 34mnutos, cada 2 o 3horas. Extraiga de ambos senos al mismo tiempo si es posible.  Si alimenta al beb con lHumana Inc extrigase leche materna aproximadamente a la misma hora.  Si bebe alcohol, espere 2horas antes de rPharmacologist  CMO ME PREPARO PARA LAS EXTRACCIONES? El reflejo de bajada es la reaccin natural del organismo que estimula el flujo de [place] Es ms fcil lograr que la leche fluya cuando usted est relajada. Pruebe estas cosas para relajarse:  Huela una de las mantas o una prenda del beb.  Mire una fotografa o un video del beb.  Sintese en un lugar que sea tranquilo y donde tenga privacidad.  Hgase masajes en la mama de la que extraer la l[place]  Aplquese calor relajante en la mama.  Ponga msica que la relaje. [place]  Lvese las manos antes de la extraccin. No es necesario que se lave los pezones ni las m[place]  Hay tres formas de rPharmacologist Puede hacer lo siguiente: ? Hgase un masaje con la mano y presione la mama. ? Use un sacaleche porttil manual. ? Use un sacaleche elctrico.  AIlona Sorrel  de que la ventosa de succin del sacaleche sea del tamao correcto. Coloque la ventosa de succin directamente sobre el pezn. Si esta no es del Radiographer, therapeutic o est mal colocada, puede provocarle dolor o lastimarle el pezn.  Aplique una pequea cantidad de lanolina purificada o modificada en el pezn y la areola si tiene dolor.  Si utiliza un Educational psychologist, cambie la velocidad y la potencia de succin para que sean ms cmodas.  Tal vez necesite un tipo de sacaleche diferente si la extraccin le resulta dolorosa o si no logra extraer [employer]. El mdico  puede ayudarla a elegir el tipo de Medical illustrator a Risk manager.  Tenga siempre cerca una botella llena de agua. Beber abundante lquido la ayuda a producir ms [employer].  Puede guardar la leche para usarla ms tarde. La leche materna que se extrajo con un sacaleche puede guardarse en un recipiente o una bolsa de plstico estril con cierre hermtico. Ponga siempre la fecha de la extraccin en el recipiente. ? [employer] materna puede dejarse a temperatura ambiente durante 8horas como mximo. ? Puede guardar la [employer] Lots refrigerador durante 8das como mximo. ? Puede Sport and exercise psychologist en el congelador durante 50mses. Para descongelar la lAuto-Owners Insurance use agua tibia. No la ponga en el microondas.  No fumar. Pdale ayuda al mBarnettAL MDICO?  Tiene dificultades para extraerse leche.  Est preocupada porque no produce la cantidad de lConseco  Siente dolor o enrojecimiento en los pezones.  Desea tomar anticonceptivos.  Esta informacin no tiene cMarine scientistel consejo del mdico. Asegrese de hacerle al mdico cualquier pregunta que tenga. Document Released: 05/14/2010 Document Revised: 04/16/2013 Document Reviewed: 02/01/2013 Elsevier Interactive Patient Education  2017 [employer]

## 2018-07-09 ENCOUNTER — Encounter: Payer: BLUE CROSS/BLUE SHIELD | Admitting: Certified Nurse Midwife

## 2019-07-13 ENCOUNTER — Ambulatory Visit: Payer: BLUE CROSS/BLUE SHIELD | Attending: Internal Medicine

## 2019-07-13 DIAGNOSIS — Z23 Encounter for immunization: Secondary | ICD-10-CM

## 2019-07-13 NOTE — Progress Notes (Signed)
   Covid-19 Vaccination Clinic  Name:  Waver Dibiasio    MRN: [id number] DOB: [birth year]  07/13/2019  Ms. Trisha Mangle Cuchillas was observed post Covid-19 immunization for 15 minutes without incident. She was provided with Vaccine Information Sheet and instruction to access the V-Safe system.   Ms. Ward Givens was instructed to call 911 with any severe reactions post vaccine: Marland Kitchen Difficulty breathing  . Swelling of face and throat  . A fast heartbeat  . A bad rash all over body  . Dizziness and weakness   Immunizations Administered    Name Date Dose VIS Date Route   Pfizer COVID-19 Vaccine 07/13/2019 11:51 AM 0.3 mL 04/05/2019 Intramuscular   Manufacturer: [employer], [employer]   Lot: [id number]   NDC: [address]-9

## 2019-08-07 ENCOUNTER — Ambulatory Visit: Payer: BLUE CROSS/BLUE SHIELD | Attending: Internal Medicine

## 2019-08-07 DIAGNOSIS — Z23 Encounter for immunization: Secondary | ICD-10-CM

## 2019-08-07 NOTE — Progress Notes (Signed)
   Covid-19 Vaccination Clinic  Name:  Destyn Parfitt    MRN: [id number] DOB: [birth year]  08/07/2019  Ms. Trisha Mangle Cuchillas was observed post Covid-19 immunization for 15 minutes without incident. She was provided with Vaccine Information Sheet and instruction to access the V-Safe system.   Ms. Ward Givens was instructed to call 911 with any severe reactions post vaccine: Marland Kitchen Difficulty breathing  . Swelling of face and throat  . A fast heartbeat  . A bad rash all over body  . Dizziness and weakness   Immunizations Administered    Name Date Dose VIS Date Route   Pfizer COVID-19 Vaccine 08/07/2019  3:45 PM 0.3 mL 04/05/2019 Intramuscular   Manufacturer: [employer], [employer]   Lot: [id number]   NDC: [address]-3

## 2019-12-02 ENCOUNTER — Other Ambulatory Visit
Admission: RE | Admit: 2019-12-02 | Discharge: 2019-12-02 | Disposition: A | Discharge status: Home / Self Care | Payer: BLUE CROSS/BLUE SHIELD | Source: Ambulatory Visit | Attending: Infectious Diseases | Admitting: Infectious Diseases

## 2019-12-02 DIAGNOSIS — R071 Chest pain on breathing: Secondary | ICD-10-CM | POA: Diagnosis present

## 2019-12-02 LAB — TROPONIN I (HIGH SENSITIVITY): Troponin I (High Sensitivity): <2 ng/L (ref <18)

## 2019-12-02 LAB — FIBRIN DERIVATIVES D-DIMER (ARMC ONLY): Fibrin derivatives D-dimer (ARMC): 277.34 ng/mL (FEU) (ref 0.00–499.00)

## 2019-12-02 LAB — BRAIN NATRIURETIC PEPTIDE: B Natriuretic Peptide: 32.3 pg/mL (ref 0.0–100.0)

## 2021-01-21 ENCOUNTER — Other Ambulatory Visit: Payer: Self-pay | Admitting: Orthopedic Surgery

## 2021-01-21 DIAGNOSIS — M25552 Pain in left hip: Secondary | ICD-10-CM

## 2021-01-21 DIAGNOSIS — M25852 Other specified joint disorders, left hip: Secondary | ICD-10-CM

## 2021-02-04 ENCOUNTER — Ambulatory Visit: Admission: RE | Admit: 2021-02-04 | Payer: BLUE CROSS/BLUE SHIELD | Source: Ambulatory Visit

## 2021-02-04 ENCOUNTER — Ambulatory Visit: Payer: BLUE CROSS/BLUE SHIELD

## 2021-02-19 ENCOUNTER — Ambulatory Visit: Payer: BLUE CROSS/BLUE SHIELD

## 2021-03-10 ENCOUNTER — Encounter: Payer: Self-pay | Admitting: Emergency Medicine

## 2021-03-10 ENCOUNTER — Other Ambulatory Visit: Payer: Self-pay

## 2021-03-10 ENCOUNTER — Emergency Department
Admission: EM | Admit: 2021-03-10 | Discharge: 2021-03-10 | Disposition: A | Discharge status: Home / Self Care | Payer: BLUE CROSS/BLUE SHIELD | Attending: Emergency Medicine | Admitting: Emergency Medicine

## 2021-03-10 ENCOUNTER — Emergency Department: Payer: BLUE CROSS/BLUE SHIELD

## 2021-03-10 DIAGNOSIS — N3001 Acute cystitis with hematuria: Secondary | ICD-10-CM | POA: Insufficient documentation

## 2021-03-10 DIAGNOSIS — R300 Dysuria: Secondary | ICD-10-CM | POA: Diagnosis present

## 2021-03-10 LAB — CBC
HCT: 41.5 % (ref 36.0–46.0)
Hemoglobin: 13.8 g/dL (ref 12.0–15.0)
MCH: 29.1 pg (ref 26.0–34.0)
MCHC: 33.3 g/dL (ref 30.0–36.0)
MCV: 87.6 fL (ref 80.0–100.0)
Platelets: 256 10*3/uL (ref 150–400)
RBC: 4.74 MIL/uL (ref 3.87–5.11)
RDW: 12.6 % (ref 11.5–15.5)
WBC: 9.2 10*3/uL (ref 4.0–10.5)
nRBC: 0 % (ref 0.0–0.2)

## 2021-03-10 LAB — POC URINE PREG, ED: Preg Test, Ur: NEGATIVE

## 2021-03-10 LAB — COMPREHENSIVE METABOLIC PANEL
ALT: 22 U/L (ref 0–44)
AST: 21 U/L (ref 15–41)
Albumin: 4.4 g/dL (ref 3.5–5.0)
Alkaline Phosphatase: 82 U/L (ref 38–126)
Anion gap: 8 (ref 5–15)
BUN: 13 mg/dL (ref 6–20)
CO2: 27 mmol/L (ref 22–32)
Calcium: 9.4 mg/dL (ref 8.9–10.3)
Chloride: 102 mmol/L (ref 98–111)
Creatinine, Ser: 0.66 mg/dL (ref 0.44–1.00)
GFR, Estimated: >60 mL/min (ref >60)
Glucose, Bld: 95 mg/dL (ref 70–99)
Potassium: 3.7 mmol/L (ref 3.5–5.1)
Sodium: 137 mmol/L (ref 135–145)
Total Bilirubin: 0.6 mg/dL (ref 0.3–1.2)
Total Protein: 7.9 g/dL (ref 6.5–8.1)

## 2021-03-10 LAB — URINALYSIS, ROUTINE W REFLEX MICROSCOPIC
Bacteria, UA: NONE SEEN
Bilirubin Urine: NEGATIVE
Glucose, UA: NEGATIVE mg/dL
Ketones, ur: NEGATIVE mg/dL
Nitrite: NEGATIVE
Protein, ur: NEGATIVE mg/dL
Specific Gravity, Urine: 1.002 — ABNORMAL LOW (ref 1.005–1.030)
pH: 7 (ref 5.0–8.0)

## 2021-03-10 LAB — LIPASE, BLOOD: Lipase: 36 U/L (ref 11–51)

## 2021-03-10 MED ORDER — IOHEXOL 300 MG/ML  SOLN
100.0000 mL | Freq: Once | INTRAMUSCULAR | Status: AC | PRN
  Administered 2021-03-10: 100 mL via INTRAVENOUS
  Filled 2021-03-10: qty 100

## 2021-03-10 MED ORDER — CEPHALEXIN 500 MG PO CAPS: 500.0000 mg | ORAL_CAPSULE | Freq: Four times a day (QID) | ORAL | 0 refills | Status: AC

## 2021-03-10 MED ORDER — SODIUM CHLORIDE 0.9 % IV SOLN
1.0000 g | Freq: Once | INTRAVENOUS | Status: AC
  Administered 2021-03-10: 1 g via INTRAVENOUS
  Filled 2021-03-10: qty 10

## 2021-03-10 NOTE — ED Provider Notes (Signed)
[facility]-EMERGENCY DEPARTMENT  ____________________________________________  Time seen: Approximately 11:23 PM  I have reviewed the triage vital signs and the nursing notes.   HISTORY  Chief Complaint Abdominal Pain   Patient     HPI Tracy Mata is a 30 y.o. female presents to the emergency department with dysuria and hematuria off and on for the past 2 weeks.  No fever or chills at home.  Patient states that she has some suprapubic and left-sided abdominal discomfort.  She has had some nausea but no vomiting.  No concerns for STDs or deep dyspareunia.   History reviewed. No pertinent past medical history.   Immunizations up to date:  Yes.     History reviewed. No pertinent past medical history.  Patient Active Problem List   Diagnosis Date Noted   History of preterm premature rupture of membranes (PPROM) 09/15/2017   History of preterm delivery 09/15/2017    Past Surgical History:  Procedure Laterality Date   FACIAL LACERATION REPAIR     TONGUE SURGERY      Prior to Admission medications   Medication Sig Start Date End Date Taking? Authorizing Provider  cephALEXin (KEFLEX) 500 MG capsule Take 1 capsule (500 mg total) by mouth 4 (four) times daily for 7 days. 03/10/21 03/17/21 Yes Lannie Fields, PA-C  Prenatal Vit-Fe Fumarate-FA (PRENATAL MULTIVITAMIN) TABS tablet Take 1 tablet by mouth daily at 12 noon.    [provider]    Allergies Patient has no known allergies.  Family History  Problem Relation Age of Onset   Heart disease Paternal Grandmother    Diabetes Paternal Grandfather    Breast cancer Neg Hx    Ovarian cancer Neg Hx    Colon cancer Neg Hx     Social History Social History   Tobacco Use   Smoking status: Never   Smokeless tobacco: Never  Vaping Use   Vaping Use: Never used  Substance Use Topics   Alcohol use: No   Drug use: Never     Review of Systems  Constitutional: No fever/chills Eyes:  No  discharge ENT: No upper respiratory complaints. Respiratory: no cough. No SOB/ use of accessory muscles to breath Gastrointestinal:   No nausea, no vomiting.  No diarrhea.  No constipation. Genitourinary: Patient has dysuria.  Musculoskeletal: Negative for musculoskeletal pain. Skin: Negative for rash, abrasions, lacerations, ecchymosis.    ____________________________________________   PHYSICAL EXAM:  VITAL SIGNS: ED Triage Vitals  Enc Vitals Group     BP 03/10/21 1748 (!) 155/128     Pulse Rate 03/10/21 1748 (!) 107     Resp 03/10/21 1748 20     Temp 03/10/21 1748 98.4 F (36.9 C)     Temp Source 03/10/21 1748 Oral     SpO2 03/10/21 1748 99 %     Weight 03/10/21 1749 159 lb 13.3 oz (72.5 kg)     Height 03/10/21 1749 5' (1.524 m)     Head Circumference --      Peak Flow --      Pain Score 03/10/21 1748 10     Pain Loc --      Pain Edu? --      Excl. in [place]? --      Constitutional: Alert and oriented. Well appearing and in no acute distress. Eyes: Conjunctivae are normal. PERRL. EOMI. Head: Atraumatic. ENT:      Nose: No congestion/rhinnorhea.      Mouth/Throat: Mucous membranes are moist.  Neck: No stridor.  No cervical spine tenderness to palpation. Cardiovascular: Normal rate, regular rhythm. Normal S1 and S2.  Good peripheral circulation. Respiratory: Normal respiratory effort without tachypnea or retractions. Lungs CTAB. Good air entry to the bases with no decreased or absent breath sounds Gastrointestinal: Bowel sounds x 4 quadrants. Soft and nontender to palpation. No guarding or rigidity. No distention. Musculoskeletal: Full range of motion to all extremities. No obvious deformities noted Neurologic:  Normal for age. No gross focal neurologic deficits are appreciated.  Skin:  Skin is warm, dry and intact. No rash noted. Psychiatric: Mood and affect are normal for age. Speech and behavior are normal.   ____________________________________________    LABS (all labs ordered are listed, but only abnormal results are displayed)  Labs Reviewed  URINALYSIS, ROUTINE W REFLEX MICROSCOPIC - Abnormal; Notable for the following components:      Result Value   Color, Urine STRAW (*)    APPearance HAZY (*)    Specific Gravity, Urine 1.002 (*)    Hgb urine dipstick LARGE (*)    Leukocytes,Ua LARGE (*)    All other components within normal limits  LIPASE, BLOOD  COMPREHENSIVE METABOLIC PANEL  CBC  POC URINE PREG, ED   ____________________________________________  EKG   ____________________________________________  RADIOLOGY Unk Pinto, personally viewed and evaluated these images (plain radiographs) as part of my medical decision making, as well as reviewing the written report by the radiologist.  CT ABDOMEN PELVIS W CONTRAST  Result Date: 03/10/2021 CLINICAL DATA:  Left lower quadrant abdominal EXAM: CT ABDOMEN AND PELVIS WITH CONTRAST TECHNIQUE: Multidetector CT imaging of the abdomen and pelvis was performed using the standard protocol following bolus administration of intravenous contrast. CONTRAST:  150mL OMNIPAQUE IOHEXOL 300 MG/ML  SOLN COMPARISON:  None. FINDINGS: LOWER CHEST: Normal. HEPATOBILIARY: Normal hepatic contours. No intra- or extrahepatic biliary dilatation. The gallbladder is normal. PANCREAS: Normal pancreas. No ductal dilatation or peripancreatic fluid collection. SPLEEN: Normal. ADRENALS/URINARY TRACT: The adrenal glands are normal. No hydronephrosis, nephroureterolithiasis or solid renal mass. Thickening of the urinary bladder wall. STOMACH/BOWEL: There is no hiatal hernia. Normal duodenal course and caliber. No small bowel dilatation or inflammation. No focal colonic abnormality. Normal appendix. VASCULAR/LYMPHATIC: Normal course and caliber of the major abdominal vessels. No abdominal or pelvic lymphadenopathy. REPRODUCTIVE: Normal uterus. No adnexal mass. MUSCULOSKELETAL. No bony spinal canal stenosis or focal  osseous abnormality. OTHER: None. IMPRESSION: Thickening of the urinary bladder wall, which may indicate cystitis. Correlate with urinalysis. Electronically Signed   By: Ulyses Jarred M.D.   On: 03/10/2021 21:59    ____________________________________________    PROCEDURES  Procedure(s) performed:     Procedures     Medications  cefTRIAXone (ROCEPHIN) 1 g in sodium chloride 0.9 % 100 mL IVPB (1 g Intravenous New Bag/Given 03/10/21 2305)  iohexol (OMNIPAQUE) 300 MG/ML solution 100 mL (100 mLs Intravenous Contrast Given 03/10/21 2147)     ____________________________________________   INITIAL IMPRESSION / ASSESSMENT AND PLAN / ED COURSE  Pertinent labs & imaging results that were available during my care of the patient were reviewed by me and considered in my medical decision making (see chart for details).    Assessment and plan Acute cystitis with hematuria 30 year old female presents to the emergency department with dysuria and hematuria.  Vital signs are reassuring at triage.  On physical exam, patient was alert, active and nontoxic-appearing with no increased work of breathing.  Urinalysis concerning for UTI with a large amount of leukocytes  Patient had bladder wall thickening  concerning for cystitis.  Patient was treated with Rocephin and Keflex.  ____________________________________________  FINAL CLINICAL IMPRESSION(S) / ED DIAGNOSES  Final diagnoses:  Acute cystitis with hematuria      NEW MEDICATIONS STARTED DURING THIS VISIT:  ED Discharge Orders          Ordered    cephALEXin (KEFLEX) 500 MG capsule  4 times daily        03/10/21 2312                This chart was dictated using voice recognition software/Dragon. Despite best efforts to proofread, errors can occur which can change the meaning. Any change was purely unintentional.     Orvil Feil, PA-C 03/11/21 0015    Delton [place], MD 03/16/21 0700

## 2021-03-10 NOTE — ED Triage Notes (Signed)
Pt comes into the ED via POV c/o LLQ abd pain that radiates into her back.  Pt states the pain is so bad that she has to walk differently.  Pt also admits to difficulty with urination.  Pt in NAD with even and unlabored respirations at this time.

## 2021-03-10 NOTE — Discharge Instructions (Addendum)
Take Keflex four times daily for the next seven days.  

## 2021-04-23 ENCOUNTER — Other Ambulatory Visit: Payer: Self-pay

## 2021-04-23 ENCOUNTER — Encounter: Payer: Self-pay | Admitting: Licensed Clinical Social Worker

## 2021-04-23 ENCOUNTER — Ambulatory Visit
Admission: EM | Admit: 2021-04-23 | Discharge: 2021-04-23 | Disposition: A | Discharge status: Home / Self Care | Payer: BLUE CROSS/BLUE SHIELD | Attending: Emergency Medicine | Admitting: Emergency Medicine

## 2021-04-23 DIAGNOSIS — J069 Acute upper respiratory infection, unspecified: Secondary | ICD-10-CM

## 2021-04-23 MED ORDER — BENZONATATE 100 MG PO CAPS: 200.0000 mg | ORAL_CAPSULE | Freq: Three times a day (TID) | ORAL | 0 refills | Status: DC

## 2021-04-23 MED ORDER — PROMETHAZINE-PHENYLEPHRINE 6.25-5 MG/5ML PO SYRP: 5.0000 mL | ORAL_SOLUTION | Freq: Four times a day (QID) | ORAL | 0 refills | Status: DC | PRN

## 2021-04-23 MED ORDER — IPRATROPIUM BROMIDE 0.06 % NA SOLN: 2.0000 | Freq: Four times a day (QID) | NASAL | 12 refills | Status: DC

## 2021-04-23 NOTE — ED Provider Notes (Signed)
[facility]    CSN: [id number] Arrival date & time: 04/23/21  1038      History   Chief Complaint Chief Complaint  Patient presents with   Nasal Congestion   Sore Throat   Headache    HPI Tracy Mata is a 30 y.o. female.   HPI  30 year old female here for evaluation of respiratory complaints.  Patient reports that she has been experiencing nasal congestion, sore throat, headache, body aches, productive cough yellow sputum, nausea, decreased appetite, and fatigue for last 2 days.  The body aches developed last night after she slept on her couch.  She denies fever, ear pain, vomiting, or abdominal pain.  History reviewed. No pertinent past medical history.  Patient Active Problem List   Diagnosis Date Noted   History of preterm premature rupture of membranes (PPROM) 09/15/2017   History of preterm delivery 09/15/2017    Past Surgical History:  Procedure Laterality Date   FACIAL LACERATION REPAIR     TONGUE SURGERY      OB History     Gravida  2   Para  2   Term  1   Preterm  1   AB      Living  2      SAB      IAB      Ectopic      Multiple  0   Live Births  2            Home Medications    Prior to Admission medications   Medication Sig Start Date End Date Taking? Authorizing Provider  benzonatate (TESSALON) 100 MG capsule Take 2 capsules (200 mg total) by mouth every 8 (eight) hours. 04/23/21  Yes Becky Augusta, NP  ipratropium (ATROVENT) 0.06 % nasal spray Place 2 sprays into both nostrils 4 (four) times daily. 04/23/21  Yes Becky Augusta, NP  promethazine-phenylephrine (PROMETHAZINE VC) 6.25-5 MG/5ML SYRP Take 5 mLs by mouth every 6 (six) hours as needed for congestion. 04/23/21  Yes Becky Augusta, NP  Prenatal Vit-Fe Fumarate-FA (PRENATAL MULTIVITAMIN) TABS tablet Take 1 tablet by mouth daily at 12 noon.    [provider]    Family History Family History  Problem Relation Age of Onset   Heart  disease Paternal Grandmother    Diabetes Paternal Grandfather    Breast cancer Neg Hx    Ovarian cancer Neg Hx    Colon cancer Neg Hx     Social History Social History   Tobacco Use   Smoking status: Never   Smokeless tobacco: Never  Vaping Use   Vaping Use: Never used  Substance Use Topics   Alcohol use: No   Drug use: Never     Allergies   Patient has no known allergies.   Review of Systems Review of Systems  Constitutional:  Positive for appetite change and fatigue. Negative for fever.  HENT:  Positive for congestion and sore throat. Negative for rhinorrhea.   Respiratory:  Positive for cough. Negative for shortness of breath and wheezing.   Gastrointestinal:  Positive for nausea. Negative for diarrhea and vomiting.  Musculoskeletal:  Positive for arthralgias and myalgias.  Skin:  Negative for rash.  Neurological:  Positive for headaches.  Hematological: Negative.     Physical Exam Triage Vital Signs ED Triage Vitals  Enc Vitals Group     BP 04/23/21 1315 (!) 116/99     Pulse Rate 04/23/21 1315 87     Resp 04/23/21 1315  16     Temp 04/23/21 1315 98.5 F (36.9 C)     Temp Source 04/23/21 1315 Oral     SpO2 04/23/21 1315 100 %     Weight 04/23/21 1313 159 lb 13.3 oz (72.5 kg)     Height 04/23/21 1313 5' (1.524 m)     Head Circumference --      Peak Flow --      Pain Score --      Pain Loc --      Pain Edu? --      Excl. in [place]? --    No data found.  Updated Vital Signs BP (!) 116/99 (BP Location: Left Arm)    Pulse 87    Temp 98.5 F (36.9 C) (Oral)    Resp 16    Ht 5' (1.524 m)    Wt 159 lb 13.3 oz (72.5 kg)    LMP 04/09/2021    SpO2 100%    BMI 31.22 kg/m   Visual Acuity Right Eye Distance:   Left Eye Distance:   Bilateral Distance:    Right Eye Near:   Left Eye Near:    Bilateral Near:     Physical Exam Vitals and nursing note reviewed.  Constitutional:      General: She is not in acute distress.    Appearance: Normal appearance. She is  normal weight. She is not ill-appearing.  HENT:     Head: Normocephalic and atraumatic.     Right Ear: Tympanic membrane, ear canal and external ear normal. There is no impacted cerumen.     Left Ear: Tympanic membrane, ear canal and external ear normal. There is no impacted cerumen.     Nose: Congestion and rhinorrhea present.     Mouth/Throat:     Mouth: Mucous membranes are moist.     Pharynx: Oropharynx is clear. Posterior oropharyngeal erythema present.  Cardiovascular:     Rate and Rhythm: Normal rate and regular rhythm.     Pulses: Normal pulses.     Heart sounds: Normal heart sounds. No murmur heard.   No gallop.  Pulmonary:     Effort: Pulmonary effort is normal.     Breath sounds: Normal breath sounds. No wheezing, rhonchi or rales.  Musculoskeletal:     Cervical back: Normal range of motion and neck supple.  Lymphadenopathy:     Cervical: No cervical adenopathy.  Skin:    General: Skin is warm and dry.     Capillary Refill: Capillary refill takes less than 2 seconds.     Findings: No erythema or rash.  Neurological:     General: No focal deficit present.     Mental Status: She is alert and oriented to person, place, and time.  Psychiatric:        Mood and Affect: Mood normal.        Behavior: Behavior normal.        Thought Content: Thought content normal.        Judgment: Judgment normal.     UC Treatments / Results  Labs (all labs ordered are listed, but only abnormal results are displayed) Labs Reviewed - No data to display  EKG   Radiology No results found.  Procedures Procedures (including critical care time)  Medications Ordered in UC Medications - No data to display  Initial Impression / Assessment and Plan / UC Course  I have reviewed the triage vital signs and the nursing notes.  Pertinent labs & imaging results  that were available during my care of the patient were reviewed by me and considered in my medical decision making (see chart for  details).  Patient is a very pleasant, nontoxic-appearing 30 year old female here for evaluation of respiratory complaints as outlined in HPI above.  On physical exam patient is pearly gray tympanic membranes bilaterally with normal light reflex and clear external auditory canals.  Nasal mucosa is erythematous and edematous with clear nasal discharge in both nares.  Oropharyngeal exam reveals mild posterior oropharyngeal erythema with clear postnasal drip.  No cervical lymphadenopathy appreciated on exam.  Cardiopulmonary exam reveals clear lung sounds in all fields.  Patient's exam is consistent with viral URI with cough.  We will treat her nasal congestion with Atrovent nasal spray and her cough with Tessalon Perles and Promethazine VC cough syrup.  Patient vies return for new or worsening symptoms.   Final Clinical Impressions(s) / UC Diagnoses   Final diagnoses:  Viral URI with cough     Discharge Instructions      Use the Atrovent nasal spray, 2 squirts in each nostril every 6 hours, as needed for runny nose and postnasal drip.  Use the Tessalon Perles every 8 hours during the day.  Take them with a small sip of water.  They may give you some numbness to the base of your tongue or a metallic taste in your mouth, this is normal.  Use the Promethazine VC cough syrup at bedtime for cough and congestion.  It will make you drowsy so do not take it during the day.  Return for reevaluation or see your primary care provider for any new or worsening symptoms.      ED Prescriptions     Medication Sig Dispense Auth. Provider   benzonatate (TESSALON) 100 MG capsule Take 2 capsules (200 mg total) by mouth every 8 (eight) hours. 21 capsule Margarette Canada, NP   ipratropium (ATROVENT) 0.06 % nasal spray Place 2 sprays into both nostrils 4 (four) times daily. 15 mL Margarette Canada, NP   promethazine-phenylephrine (PROMETHAZINE VC) 6.25-5 MG/5ML SYRP Take 5 mLs by mouth every 6 (six) hours as needed for  congestion. 180 mL Margarette Canada, NP      PDMP not reviewed this encounter.   Margarette Canada, NP 04/23/21 1355

## 2021-04-23 NOTE — Discharge Instructions (Signed)
Use the Atrovent nasal spray, 2 squirts in each nostril every 6 hours, as needed for runny nose and postnasal drip.  Use the Tessalon Perles every 8 hours during the day.  Take them with a small sip of water.  They may give you some numbness to the base of your tongue or a metallic taste in your mouth, this is normal.  Use the Promethazine VC cough syrup at bedtime for cough and congestion.  It will make you drowsy so do not take it during the day.  Return for reevaluation or see your primary care provider for any new or worsening symptoms.

## 2021-04-23 NOTE — ED Triage Notes (Signed)
Pt c/o sore throat, headache, congestion, body aches x 2 days.

## 2021-04-25 NOTE — L&D Delivery Note (Addendum)
Date of delivery: 01/01/2022 Estimated Date of Delivery: 01/12/22 Patient's last menstrual period was 04/07/2021. EGA: [redacted]w[redacted]d  Delivery Note At 7:26 PM a viable female was delivered via Vaginal, Spontaneous  Presentation: OA with restitution to  Left Occiput Anterior, Left compound hand   APGAR: 7, 9;      Weight:  3260 g, 7 pounds 3ounces Placenta status: Spontaneous, Intact.   Cord: 3 vessels with the following complications: None.  Cord pH: NA  Arrived to patient room- RNs repositioning patient due to deep variables. Pitocin turned off. Patient found to be complete and +2/+3. She pushed with good effort to deliver a viable female infant.  The head followed by shoulders, which delivered without difficulty, and the rest of the body.  Shoulder cord noted and reduced after delivery.  Baby to mom's chest.  Cord clamped and cut after 3 min delay.  Cord blood obtained.  Placenta delivered spontaneously, intact, with a 3-vessel cord.  First degree laceration hemostatic not requiring repair. Hemostasis obtained with IV pitocin and fundal massage.     Anesthesia: Epidural Episiotomy: None Lacerations: first degree/hemostatic Suture Repair:  NA Est. Blood Loss (mL): 150  Mom to postpartum.  Baby to Couplet care / Skin to Skin.   Tresea Mall. CNM 01/01/2022, 7:58 PM

## 2021-06-30 DIAGNOSIS — Z3490 Encounter for supervision of normal pregnancy, unspecified, unspecified trimester: Secondary | ICD-10-CM | POA: Insufficient documentation

## 2021-07-19 ENCOUNTER — Ambulatory Visit
Admission: EM | Admit: 2021-07-19 | Discharge: 2021-07-19 | Disposition: A | Discharge status: Home / Self Care | Payer: BLUE CROSS/BLUE SHIELD | Attending: Emergency Medicine | Admitting: Emergency Medicine

## 2021-07-19 ENCOUNTER — Other Ambulatory Visit: Payer: Self-pay

## 2021-07-19 DIAGNOSIS — Z3A00 Weeks of gestation of pregnancy not specified: Secondary | ICD-10-CM | POA: Diagnosis not present

## 2021-07-19 DIAGNOSIS — R051 Acute cough: Secondary | ICD-10-CM | POA: Diagnosis present

## 2021-07-19 DIAGNOSIS — O219 Vomiting of pregnancy, unspecified: Secondary | ICD-10-CM | POA: Diagnosis not present

## 2021-07-19 DIAGNOSIS — O99519 Diseases of the respiratory system complicating pregnancy, unspecified trimester: Secondary | ICD-10-CM | POA: Insufficient documentation

## 2021-07-19 DIAGNOSIS — O98519 Other viral diseases complicating pregnancy, unspecified trimester: Secondary | ICD-10-CM | POA: Diagnosis not present

## 2021-07-19 DIAGNOSIS — B349 Viral infection, unspecified: Secondary | ICD-10-CM | POA: Insufficient documentation

## 2021-07-19 DIAGNOSIS — Z20822 Contact with and (suspected) exposure to covid-19: Secondary | ICD-10-CM | POA: Diagnosis not present

## 2021-07-19 DIAGNOSIS — Z3490 Encounter for supervision of normal pregnancy, unspecified, unspecified trimester: Secondary | ICD-10-CM

## 2021-07-19 LAB — RESP PANEL BY RT-PCR (FLU A&B, COVID) ARPGX2
Influenza A by PCR: NEGATIVE
Influenza B by PCR: NEGATIVE
SARS Coronavirus 2 by RT PCR: NEGATIVE

## 2021-07-19 NOTE — Discharge Instructions (Addendum)
-  Your COVID test was negative.  Since you have been exposed I would still take another COVID test in 2 days.  You can purchase over-the-counter COVID test and oftentimes insurance will cover it. ?- If you are positive for COVID you need to isolate 5 days and wear mask 5 days and inform your OB/GYN. ?- It is safe to take Robitussin-DM in pregnancy.  Can also use nasal saline, Zyrtec.  Throat lozenges are also fine. ?- Need to be seen again if you have any uncontrollable fever, weakness, breathing difficulty, abdominal pain or abnormal bleeding. ?

## 2021-07-19 NOTE — ED Provider Notes (Signed)
?[facility] ? ? ? ?CSN: [id number] ?Arrival date & time: 07/19/21  1234 ? ? ?  ? ?History   ?Chief Complaint ?Chief Complaint  ?Patient presents with  ? Cough  ? ? ?HPI ?Tracy Mata is a 31 y.o. female who is reportedly "3-4 months pregnant." She says she has been ill for 2 days with cough, congestion, sore throat (mainly with coughing), fatigue. Denies fever. Has had nausea and a couple episodes of vomiting. No SOB, abdominal pain, weakness. Has not taken any OTC meds. Has been exposed to COVID through a nephew the day before symptoms began.  She is concerned about the possibility of COVID-19 and would like to be tested.  No other complaints. ? ?HPI ? ?History reviewed. No pertinent past medical history. ? ?Patient Active Problem List  ? Diagnosis Date Noted  ? History of preterm premature rupture of membranes (PPROM) 09/15/2017  ? History of preterm delivery 09/15/2017  ? ? ?Past Surgical History:  ?Procedure Laterality Date  ? FACIAL LACERATION REPAIR    ? TONGUE SURGERY    ? ? ?OB History   ? ? Gravida  ?3  ? Para  ?2  ? Term  ?1  ? Preterm  ?1  ? AB  ?   ? Living  ?2  ?  ? ? SAB  ?   ? IAB  ?   ? Ectopic  ?   ? Multiple  ?0  ? Live Births  ?2  ?   ?  ?  ? ? ? ?Home Medications   ? ?Prior to Admission medications   ?Medication Sig Start Date End Date Taking? Authorizing Provider  ?Prenatal Vit-Fe Fumarate-FA (PRENATAL MULTIVITAMIN) TABS tablet Take 1 tablet by mouth daily at 12 noon.   Yes [provider]  ? ? ?Family History ?Family History  ?Problem Relation Age of Onset  ? Heart disease Paternal Grandmother   ? Diabetes Paternal Grandfather   ? Breast cancer Neg Hx   ? Ovarian cancer Neg Hx   ? Colon cancer Neg Hx   ? ? ?Social History ?Social History  ? ?Tobacco Use  ? Smoking status: Never  ? Smokeless tobacco: Never  ?Vaping Use  ? Vaping Use: Never used  ?Substance Use Topics  ? Alcohol use: No  ? Drug use: Never  ? ? ? ?Allergies   ?Patient has no known  allergies. ? ? ?Review of Systems ?Review of Systems  ?Constitutional:  Positive for appetite change. Negative for chills, diaphoresis, fatigue and fever.  ?HENT:  Positive for congestion. Negative for ear pain, rhinorrhea, sinus pressure, sinus pain and sore throat.   ?Respiratory:  Positive for cough. Negative for shortness of breath.   ?Gastrointestinal:  Positive for nausea and vomiting. Negative for abdominal pain.  ?Musculoskeletal:  Negative for arthralgias and myalgias.  ?Skin:  Negative for rash.  ?Neurological:  Negative for weakness and headaches.  ?Hematological:  Negative for adenopathy.  ? ? ?Physical Exam ?Triage Vital Signs ?ED Triage Vitals  ?Enc Vitals Group  ?   BP 07/19/21 1333 107/75  ?   Pulse Rate 07/19/21 1333 (!) 122  ?   Resp 07/19/21 1333 18  ?   Temp 07/19/21 1333 98.4 ?F (36.9 ?C)  ?   Temp Source 07/19/21 1333 Oral  ?   SpO2 07/19/21 1333 98 %  ?   Weight 07/19/21 1333 161 lb (73 kg)  ?   Height 07/19/21 1333 5' (1.524 m)  ?  Head Circumference --   ?   Peak Flow --   ?   Pain Score 07/19/21 1332 8  ?   Pain Loc --   ?   Pain Edu? --   ?   Excl. in [place]? --   ? ?No data found. ? ?Updated Vital Signs ?BP 107/75 (BP Location: Left Arm)   Pulse (!) 122   Temp 98.4 ?F (36.9 ?C) (Oral)   Resp 18   Ht 5' (1.524 m)   Wt 161 lb (73 kg)   LMP 04/09/2021   SpO2 98%   BMI 31.44 kg/m?  ?   ? ?Physical Exam ?Vitals and nursing note reviewed.  ?Constitutional:   ?   General: She is not in acute distress. ?   Appearance: Normal appearance. She is ill-appearing (mildly). She is not toxic-appearing.  ?HENT:  ?   Head: Normocephalic and atraumatic.  ?   Nose: Congestion present.  ?   Mouth/Throat:  ?   Mouth: Mucous membranes are moist.  ?   Pharynx: Oropharynx is clear.  ?Eyes:  ?   General: No scleral icterus.    ?   Right eye: No discharge.     ?   Left eye: No discharge.  ?   Conjunctiva/sclera: Conjunctivae normal.  ?Cardiovascular:  ?   Rate and Rhythm: Regular rhythm. Tachycardia present.   ?   Heart sounds: Normal heart sounds.  ?Pulmonary:  ?   Effort: Pulmonary effort is normal. No respiratory distress.  ?   Breath sounds: Normal breath sounds.  ?Musculoskeletal:  ?   Cervical back: Neck supple.  ?Skin: ?   General: Skin is dry.  ?Neurological:  ?   General: No focal deficit present.  ?   Mental Status: She is alert. Mental status is at baseline.  ?   Motor: No weakness.  ?   Gait: Gait normal.  ?Psychiatric:     ?   Mood and Affect: Mood normal.     ?   Behavior: Behavior normal.     ?   Thought Content: Thought content normal.  ? ? ? ?UC Treatments / Results  ?Labs ?(all labs ordered are listed, but only abnormal results are displayed) ?Labs Reviewed  ?RESP PANEL BY RT-PCR (FLU A&B, COVID) ARPGX2  ? ? ?EKG ? ? ?Radiology ?No results found. ? ?Procedures ?Procedures (including critical care time) ? ?Medications Ordered in UC ?Medications - No data to display ? ?Initial Impression / Assessment and Plan / UC Course  ?I have reviewed the triage vital signs and the nursing notes. ? ?Pertinent labs & imaging results that were available during my care of the patient were reviewed by me and considered in my medical decision making (see chart for details). ? ?31 year old female presenting for 2-day history of cough, congestion, fatigue, reduced appetite, nausea and vomiting.  Reports exposure to COVID-19 the day before symptoms started.  Vitals are stable.  Patient is mildly ill-appearing but nontoxic.  On exam she does have mild nasal congestion and erythema posterior pharynx.  Her chest is clear to auscultation.  Heart regular rhythm.  Respiratory panel obtained.  Patient negative for flu and COVID.  Discussed this with her.  Advised her she should still take another COVID test in 2 days given her exposure and the fact that she is pregnant.  Advised her symptoms consistent with a virus.  Supportive care encouraged with increasing rest and fluids.  Advised over-the-counter Robitussin-DM, nasal saline,  Zyrtec, throat lozenges.  Reviewed current CDC guidance, isolation protocol and ED precautions if she test positive for COVID-19 on a home test and advised her to contact her OB/GYN. ? ?Final Clinical Impressions(s) / UC Diagnoses  ? ?Final diagnoses:  ?Viral illness  ?Acute cough  ?Pregnancy, unspecified gestational age  ? ? ? ?Discharge Instructions   ? ?  ?-Your COVID test was negative.  Since you have been exposed I would still take another COVID test in 2 days.  You can purchase over-the-counter COVID test and oftentimes insurance will cover it. ?- If you are positive for COVID you need to isolate 5 days and wear mask 5 days and inform your OB/GYN. ?- It is safe to take Robitussin-DM in pregnancy.  Can also use nasal saline, Zyrtec.  Throat lozenges are also fine. ?- Need to be seen again if you have any uncontrollable fever, weakness, breathing difficulty, abdominal pain or abnormal bleeding. ? ? ? ? ?ED Prescriptions   ?None ?  ? ?PDMP not reviewed this encounter. ?  ?Shirlee Latch, PA-C ?07/19/21 1441 ? ?

## 2021-07-19 NOTE — ED Triage Notes (Signed)
Pt c/o fatigued, cough, chest congestion, vomiting from mucus, loss of appetite x3days ? ?Pt is 3-4 months pregnant. ? ?Pt was around her nephew who was positive for covid.  ? ?Pt would like a covid test done.  ?

## 2021-08-18 ENCOUNTER — Encounter: Payer: Self-pay | Admitting: Obstetrics

## 2021-08-18 ENCOUNTER — Other Ambulatory Visit (HOSPITAL_COMMUNITY)
Admission: RE | Admit: 2021-08-18 | Discharge: 2021-08-18 | Disposition: A | Discharge status: Home / Self Care | Payer: BLUE CROSS/BLUE SHIELD | Source: Ambulatory Visit | Attending: Obstetrics | Admitting: Obstetrics

## 2021-08-18 ENCOUNTER — Ambulatory Visit: Payer: BLUE CROSS/BLUE SHIELD | Admitting: Obstetrics

## 2021-08-18 VITALS — BP 118/78 | HR 108 | Wt 171.0 lb

## 2021-08-18 DIAGNOSIS — O26892 Other specified pregnancy related conditions, second trimester: Secondary | ICD-10-CM | POA: Insufficient documentation

## 2021-08-18 DIAGNOSIS — Z3A19 19 weeks gestation of pregnancy: Secondary | ICD-10-CM | POA: Insufficient documentation

## 2021-08-18 DIAGNOSIS — N898 Other specified noninflammatory disorders of vagina: Secondary | ICD-10-CM

## 2021-08-18 DIAGNOSIS — Z1379 Encounter for other screening for genetic and chromosomal anomalies: Secondary | ICD-10-CM

## 2021-08-18 DIAGNOSIS — Z3482 Encounter for supervision of other normal pregnancy, second trimester: Secondary | ICD-10-CM

## 2021-08-18 DIAGNOSIS — Z3200 Encounter for pregnancy test, result unknown: Secondary | ICD-10-CM | POA: Diagnosis not present

## 2021-08-18 DIAGNOSIS — Z113 Encounter for screening for infections with a predominantly sexual mode of transmission: Secondary | ICD-10-CM

## 2021-08-18 DIAGNOSIS — Z124 Encounter for screening for malignant neoplasm of cervix: Secondary | ICD-10-CM

## 2021-08-18 DIAGNOSIS — Z0283 Encounter for blood-alcohol and blood-drug test: Secondary | ICD-10-CM

## 2021-08-18 LAB — OB RESULTS CONSOLE VARICELLA ZOSTER ANTIBODY, IGG: Varicella: NON-IMMUNE/NOT IMMUNE

## 2021-08-18 LAB — POCT URINE PREGNANCY: Preg Test, Ur: POSITIVE — AB

## 2021-08-18 NOTE — Progress Notes (Signed)
1NEW OB HISTORY AND PHYSICAL ? ?SUBJECTIVE:  ?    ? Tracy Mata is a 31 y.o. [phone] female, Patient's last menstrual period was 04/07/2021., Estimated Date of Delivery: 01/12/22, [redacted]w[redacted]d, presents today for establishment of Prenatal Care. An interpreter is present with her. ?She reports fatigue and varicose veins.  ? ?Social history ?Partner/Relationship: married, feels safe at home ?Living situation: husband and 2 children ?Works in a [employer] 6 days a week ?Exercise: work ?Substance use: denies EtOH, tobacco, vape, and marijuana ?  ?Gynecologic History ?Patient's last menstrual period was 04/07/2021. Normal ?Periods were irregular prior to pregnancy ?Contraception: none ?Last Pap: unsure. ? ?Obstetric History ?OB History  ?Gravida Para Term Preterm AB Living  ?3 2 1 1   2   ?SAB IAB Ectopic Multiple Live Births  ?      0 2  ?  ?# Outcome Date GA Lbr Len/2nd Weight Sex Delivery Anes PTL Lv  ?3 Current           ?2 Term 01/14/18 [redacted]w[redacted]d  7 lb 14.3 oz (3.58 kg) M Vag-Spont None  LIV  ?1 Preterm 2014   8 lb 11 oz (3.941 kg) M Vag-Spont  Y LIV  ? ? ?No past medical history on file. ? ?Past Surgical History:  ?Procedure Laterality Date  ? FACIAL LACERATION REPAIR    ? TONGUE SURGERY    ? ? ?Current Outpatient Medications on File Prior to Visit  ?Medication Sig Dispense Refill  ? Prenatal Vit-Fe Fumarate-FA (PRENATAL MULTIVITAMIN) TABS tablet Take 1 tablet by mouth daily at 12 noon.    ? ?No current facility-administered medications on file prior to visit.  ? ? ?No Known Allergies ? ?Social History  ? ?Socioeconomic History  ? Marital status: Married  ?  Spouse name: Not on file  ? Number of children: Not on file  ? Years of education: Not on file  ? Highest education level: Not on file  ?Occupational History  ? Not on file  ?Tobacco Use  ? Smoking status: Never  ? Smokeless tobacco: Never  ?Vaping Use  ? Vaping Use: Never used  ?Substance and Sexual Activity  ? Alcohol use: No  ? Drug use: Never  ?  Sexual activity: Not Currently  ?  Birth control/protection: None  ?Other Topics Concern  ? Not on file  ?Social History Narrative  ? Not on file  ? ?Social Determinants of Health  ? ?Financial Resource Strain: Not on file  ?Food Insecurity: Not on file  ?Transportation Needs: Not on file  ?Physical Activity: Not on file  ?Stress: Not on file  ?Social Connections: Not on file  ?Intimate Partner Violence: Not on file  ? ? ?Family History  ?Problem Relation Age of Onset  ? Heart disease Paternal Grandmother   ? Diabetes Paternal Grandfather   ? Breast cancer Neg Hx   ? Ovarian cancer Neg Hx   ? Colon cancer Neg Hx   ? ? ?The following portions of the patient's history were reviewed and updated as appropriate: allergies, current medications, past OB history, past medical history, past surgical history, past family history, past social history, and problem list. ? ?History obtained from the patient ?General ROS: positive for  - fatigue ?negative for - chills ?Psychological ROS: negative for - anxiety or depression ?Allergy and Immunology ROS: negative ?Hematological and Lymphatic ROS: positive for - bleeding problems and swollen lymph nodes ?negative for - varicose veins in left lef ?Endocrine ROS: negative for -  breast changes, mood swings, or palpitations ?Breast ROS: negative for breast lumps ?Respiratory ROS: no cough, shortness of breath, or wheezing ?Cardiovascular ROS: no chest pain or dyspnea on exertion ?Gastrointestinal ROS: no abdominal pain, change in bowel habits, or black or bloody stools ?Genito-Urinary ROS: no dysuria, trouble voiding, or hematuria ?Musculoskeletal ROS: negative ?Dermatological ROS: positive for rash ? ? ?OBJECTIVE: ?Initial Physical Exam (New OB) ? ?GENERAL APPEARANCE: alert, well appearing ?HEAD: normocephalic, atraumatic ?MOUTH: mucous membranes moist, pharynx normal without lesions ?THYROID: no thyromegaly or masses present ?BREASTS: no masses noted, no significant tenderness, no  palpable axillary nodes, no skin changes ?LUNGS: clear to auscultation, no wheezes, rales or rhonchi, symmetric air entry ?HEART: regular rate and rhythm, no murmurs ?ABDOMEN: soft, nontender, nondistended, no abnormal masses, no epigastric pain, fundus soft, nontender 18 cm, FHT present: 152 ?EXTREMITIES: no redness or tenderness in the calves or thighs, varicose veins noted, left leg ?SKIN: normal coloration and turgor, no rashes ?LYMPH NODES: no adenopathy palpable ?NEUROLOGIC: alert, oriented, normal speech, no focal findings or movement disorder noted ? ?PELVIC EXAM EXTERNAL GENITALIA: normal appearing vulva with no masses, tenderness or lesions ?VAGINA: discharge thick, yellow and some white, curd-like discharge on cervix ?CERVIX: no lesions or cervical motion tenderness and Pap collected ? ?ASSESSMENT: ?Normal pregnancy at [redacted]w[redacted]d ?Late entry to care ? ?PLAN: ?Routine prenatal care. We discussed an overview of prenatal care and when to call. Reviewed diet, exercise, and weight gain recommendations in pregnancy. Discussed benefits of breastfeeding and lactation resources at [facility]. Labs and genetic testing done today. Anatomy US ordered. Will schedule nurse visit. ? ?See orders ? ?Lloyd Huger, CNM  ?

## 2021-08-19 LAB — URINALYSIS, ROUTINE W REFLEX MICROSCOPIC
Bilirubin, UA: NEGATIVE
Glucose, UA: NEGATIVE
Ketones, UA: NEGATIVE
Nitrite, UA: NEGATIVE
RBC, UA: NEGATIVE
Specific Gravity, UA: 1.015 (ref 1.005–1.030)
Urobilinogen, Ur: 0.2 mg/dL (ref 0.2–1.0)
pH, UA: 6.5 (ref 5.0–7.5)

## 2021-08-19 LAB — ANTIBODY SCREEN: Antibody Screen: NEGATIVE

## 2021-08-19 LAB — MICROSCOPIC EXAMINATION
Casts: NONE SEEN /lpf
Epithelial Cells (non renal): >10 /hpf — AB (ref 0–10)
RBC, Urine: NONE SEEN /hpf (ref 0–2)

## 2021-08-19 LAB — ABO AND RH: Rh Factor: POSITIVE

## 2021-08-19 LAB — HIV ANTIBODY (ROUTINE TESTING W REFLEX): HIV Screen 4th Generation wRfx: NONREACTIVE

## 2021-08-19 LAB — VIRAL HEPATITIS HBV, HCV
HCV Ab: NONREACTIVE
Hep B Core Total Ab: NEGATIVE
Hep B Surface Ab, Qual: NONREACTIVE
Hepatitis B Surface Ag: NEGATIVE

## 2021-08-19 LAB — HCV INTERPRETATION

## 2021-08-19 LAB — VARICELLA ZOSTER ANTIBODY, IGG: Varicella zoster IgG: <135 index — ABNORMAL LOW (ref >165)

## 2021-08-19 LAB — RPR: RPR Ser Ql: NONREACTIVE

## 2021-08-19 LAB — TOXOPLASMA ANTIBODIES- IGG AND  IGM
Toxoplasma Antibody- IgM: 3.4 AU/mL (ref 0.0–7.9)
Toxoplasma IgG Ratio: 27.3 IU/mL — ABNORMAL HIGH (ref 0.0–7.1)

## 2021-08-19 LAB — RUBELLA SCREEN: Rubella Antibodies, IGG: 3.1 index (ref >0.99)

## 2021-08-20 ENCOUNTER — Encounter: Payer: Self-pay | Admitting: Obstetrics

## 2021-08-20 LAB — CERVICOVAGINAL ANCILLARY ONLY
Bacterial Vaginitis (gardnerella): POSITIVE — AB
Candida Glabrata: NEGATIVE
Candida Vaginitis: POSITIVE — AB
Comment: NEGATIVE
Comment: NEGATIVE
Comment: NEGATIVE
Comment: NEGATIVE
Trichomonas: NEGATIVE

## 2021-08-20 LAB — MONITOR DRUG PROFILE 14(MW)
Amphetamine Scrn, Ur: NEGATIVE ng/mL
BARBITURATE SCREEN URINE: NEGATIVE ng/mL
BENZODIAZEPINE SCREEN, URINE: NEGATIVE ng/mL
Buprenorphine, Urine: NEGATIVE ng/mL
CANNABINOIDS UR QL SCN: NEGATIVE ng/mL
Cocaine (Metab) Scrn, Ur: NEGATIVE ng/mL
Creatinine(Crt), U: 50.9 mg/dL (ref 20.0–300.0)
Fentanyl, Urine: NEGATIVE pg/mL
Meperidine Screen, Urine: NEGATIVE ng/mL
Methadone Screen, Urine: NEGATIVE ng/mL
OXYCODONE+OXYMORPHONE UR QL SCN: NEGATIVE ng/mL
Opiate Scrn, Ur: NEGATIVE ng/mL
Ph of Urine: 6.4 (ref 4.5–8.9)
Phencyclidine Qn, Ur: NEGATIVE ng/mL
Propoxyphene Scrn, Ur: NEGATIVE ng/mL
SPECIFIC GRAVITY: 1.014
Tramadol Screen, Urine: NEGATIVE ng/mL

## 2021-08-20 LAB — NICOTINE SCREEN, URINE: Cotinine Ql Scrn, Ur: NEGATIVE ng/mL

## 2021-08-20 LAB — GC/CHLAMYDIA PROBE AMP
Chlamydia trachomatis, NAA: NEGATIVE
Neisseria Gonorrhoeae by PCR: NEGATIVE

## 2021-08-20 LAB — URINE CULTURE, OB REFLEX

## 2021-08-20 LAB — CULTURE, OB URINE

## 2021-08-24 LAB — CYTOLOGY - PAP
Comment: NEGATIVE
Diagnosis: NEGATIVE
High risk HPV: NEGATIVE

## 2021-08-25 ENCOUNTER — Other Ambulatory Visit: Payer: Self-pay | Admitting: Obstetrics

## 2021-08-25 ENCOUNTER — Ambulatory Visit (INDEPENDENT_AMBULATORY_CARE_PROVIDER_SITE_OTHER): Payer: BLUE CROSS/BLUE SHIELD

## 2021-08-25 DIAGNOSIS — Z3A19 19 weeks gestation of pregnancy: Secondary | ICD-10-CM

## 2021-08-25 DIAGNOSIS — Z3482 Encounter for supervision of other normal pregnancy, second trimester: Secondary | ICD-10-CM

## 2021-09-07 ENCOUNTER — Encounter: Payer: Self-pay | Admitting: Obstetrics

## 2021-09-07 ENCOUNTER — Other Ambulatory Visit: Payer: Self-pay | Admitting: Obstetrics

## 2021-09-07 MED ORDER — METRONIDAZOLE 500 MG PO TABS: 500.0000 mg | ORAL_TABLET | Freq: Two times a day (BID) | ORAL | 0 refills | Status: DC

## 2021-09-07 MED ORDER — MONISTAT 7 COMPLETE THERAPY 100-2 MG-% VA KIT: 1.0000 | PACK | Freq: Every day | VAGINAL | 0 refills | Status: DC

## 2021-09-15 ENCOUNTER — Ambulatory Visit (INDEPENDENT_AMBULATORY_CARE_PROVIDER_SITE_OTHER): Payer: BLUE CROSS/BLUE SHIELD | Admitting: Certified Nurse Midwife

## 2021-09-15 ENCOUNTER — Other Ambulatory Visit: Payer: Self-pay

## 2021-09-15 ENCOUNTER — Encounter: Payer: Self-pay | Admitting: Certified Nurse Midwife

## 2021-09-15 ENCOUNTER — Other Ambulatory Visit: Payer: Self-pay | Admitting: Certified Nurse Midwife

## 2021-09-15 VITALS — BP 117/75 | HR 116 | Wt 174.1 lb

## 2021-09-15 DIAGNOSIS — Z3482 Encounter for supervision of other normal pregnancy, second trimester: Secondary | ICD-10-CM

## 2021-09-15 DIAGNOSIS — Z3A37 37 weeks gestation of pregnancy: Secondary | ICD-10-CM | POA: Diagnosis not present

## 2021-09-15 DIAGNOSIS — R399 Unspecified symptoms and signs involving the genitourinary system: Secondary | ICD-10-CM

## 2021-09-15 DIAGNOSIS — Z3483 Encounter for supervision of other normal pregnancy, third trimester: Secondary | ICD-10-CM | POA: Diagnosis not present

## 2021-09-15 DIAGNOSIS — Z23 Encounter for immunization: Secondary | ICD-10-CM | POA: Diagnosis not present

## 2021-09-15 LAB — POCT URINALYSIS DIPSTICK OB
Bilirubin, UA: NEGATIVE
Blood, UA: NEGATIVE
Glucose, UA: NEGATIVE
Ketones, UA: NEGATIVE
Nitrite, UA: NEGATIVE
Spec Grav, UA: 1.02 (ref 1.010–1.025)
Urobilinogen, UA: 0.2 E.U./dL
pH, UA: 6 (ref 5.0–8.0)

## 2021-09-15 MED ORDER — METRONIDAZOLE 500 MG PO TABS: 500.0000 mg | ORAL_TABLET | Freq: Two times a day (BID) | ORAL | 0 refills | Status: DC

## 2021-09-15 MED ORDER — MONISTAT 7 COMPLETE THERAPY 100-2 MG-% VA KIT: 1.0000 | PACK | Freq: Every day | VAGINAL | 0 refills | Status: DC

## 2021-09-15 NOTE — Progress Notes (Signed)
ROB doing well, feeling a little movement. Discussed glucose screening test next visit. Reviewed diet prior to testing. She verbalizes and agrees to plan. She has varicose veins and has been using compression socks to mange which does help. Encouraged hydration and elevation of legs when possible. Follow up @ 28 wks for glucose screening.   Philip Aspen, CNM

## 2021-09-15 NOTE — Patient Instructions (Signed)
Oral Glucose Tolerance Test During Pregnancy Why am I having this test? The oral glucose tolerance test (OGTT) is done to check how your body processes blood sugar (glucose). This is one of several tests used to diagnose diabetes that develops during pregnancy (gestational diabetes mellitus). Gestational diabetes is a short-term form of diabetes that some women develop while they are pregnant. It usually occurs during the second trimester of pregnancy and goes away after delivery. Testing, or screening, for gestational diabetes usually occurs at weeks 24-28 of pregnancy. You may have the OGTT test after having a 1-hour glucose screening test if the results from that test indicate that you may have gestational diabetes. This test may also be needed if: You have a history of gestational diabetes. There is a history of giving birth to very large babies or of losing pregnancies (having stillbirths). You have signs and symptoms of diabetes, such as: Changes in your eyesight. Tingling or numbness in your hands or feet. Changes in hunger, thirst, and urination, and these are not explained by your pregnancy. What is being tested? This test measures the amount of glucose in your blood at different times during a period of 3 hours. This shows how well your body can process glucose. What kind of sample is taken?  Blood samples are required for this test. They are usually collected by inserting a needle into a blood vessel. How do I prepare for this test? For 3 days before your test, eat normally. Have plenty of carbohydrate-rich foods. Follow instructions from your health care provider about: Eating or drinking restrictions on the day of the test. You may be asked not to eat or drink anything other than water (to fast) starting 8-10 hours before the test. Changing or stopping your regular medicines. Some medicines may interfere with this test. Tell a health care provider about: All medicines you are  taking, including vitamins, herbs, eye drops, creams, and over-the-counter medicines. Any blood disorders you have. Any surgeries you have had. Any medical conditions you have. What happens during the test? First, your blood glucose will be measured. This is referred to as your fasting blood glucose because you fasted before the test. Then, you will drink a glucose solution that contains a certain amount of glucose. Your blood glucose will be measured again 1, 2, and 3 hours after you drink the solution. This test takes about 3 hours to complete. You will need to stay at the testing location during this time. During the testing period: Do not eat or drink anything other than the glucose solution. Do not exercise. Do not use any products that contain nicotine or tobacco, such as cigarettes, e-cigarettes, and chewing tobacco. These can affect your test results. If you need help quitting, ask your health care provider. The testing procedure may vary among health care providers and hospitals. How are the results reported? Your results will be reported as milligrams of glucose per deciliter of blood (mg/dL) or millimoles per liter (mmol/L). There is more than one source for screening and diagnosis reference values used to diagnose gestational diabetes. Your health care provider will compare your results to normal values that were established after testing a large group of people (reference values). Reference values may vary among labs and hospitals. For this test (Carpenter-Coustan), reference values are: Fasting: 95 mg/dL (5.3 mmol/L). 1 hour: 180 mg/dL (10.0 mmol/L). 2 hour: 155 mg/dL (8.6 mmol/L). 3 hour: 140 mg/dL (7.8 mmol/L). What do the results mean? Results below the reference values are   considered normal. If two or more of your blood glucose levels are at or above the reference values, you may be diagnosed with gestational diabetes. If only one level is high, your health care provider may  suggest repeat testing or other tests to confirm a diagnosis. Talk with your health care provider about what your results mean. Questions to ask your health care provider Ask your health care provider, or the department that is doing the test: When will my results be ready? How will I get my results? What are my treatment options? What other tests do I need? What are my next steps? Summary The oral glucose tolerance test (OGTT) is one of several tests used to diagnose diabetes that develops during pregnancy (gestational diabetes mellitus). Gestational diabetes is a short-term form of diabetes that some women develop while they are pregnant. You may have the OGTT test after having a 1-hour glucose screening test if the results from that test show that you may have gestational diabetes. You may also have this test if you have any symptoms or risk factors for this type of diabetes. Talk with your health care provider about what your results mean. This information is not intended to replace advice given to you by your health care provider. Make sure you discuss any questions you have with your health care provider. Document Revised: 09/19/2019 Document Reviewed: 09/19/2019 Elsevier Patient Education  2023 Elsevier Inc.  

## 2021-09-16 MED ORDER — METRONIDAZOLE 500 MG PO TABS: 500.0000 mg | ORAL_TABLET | Freq: Two times a day (BID) | ORAL | 0 refills | Status: DC

## 2021-09-16 MED ORDER — MONISTAT 7 COMPLETE THERAPY 100-2 MG-% VA KIT: 1.0000 | PACK | Freq: Every day | VAGINAL | 0 refills | Status: DC

## 2021-09-17 ENCOUNTER — Other Ambulatory Visit: Payer: Self-pay | Admitting: Certified Nurse Midwife

## 2021-09-17 ENCOUNTER — Encounter: Payer: Self-pay | Admitting: Certified Nurse Midwife

## 2021-09-17 LAB — URINE CULTURE

## 2021-09-17 MED ORDER — NITROFURANTOIN MONOHYD MACRO 100 MG PO CAPS: 100.0000 mg | ORAL_CAPSULE | Freq: Two times a day (BID) | ORAL | 0 refills | Status: AC

## 2021-09-19 ENCOUNTER — Other Ambulatory Visit: Payer: Self-pay | Admitting: Certified Nurse Midwife

## 2021-09-30 ENCOUNTER — Ambulatory Visit (INDEPENDENT_AMBULATORY_CARE_PROVIDER_SITE_OTHER): Payer: BLUE CROSS/BLUE SHIELD | Admitting: Obstetrics

## 2021-09-30 ENCOUNTER — Encounter: Payer: Self-pay | Admitting: Obstetrics

## 2021-09-30 ENCOUNTER — Other Ambulatory Visit: Payer: BLUE CROSS/BLUE SHIELD

## 2021-09-30 VITALS — BP 117/78 | HR 120 | Wt 173.9 lb

## 2021-09-30 DIAGNOSIS — Z3A25 25 weeks gestation of pregnancy: Secondary | ICD-10-CM

## 2021-09-30 DIAGNOSIS — Z3482 Encounter for supervision of other normal pregnancy, second trimester: Secondary | ICD-10-CM

## 2021-09-30 LAB — POCT URINALYSIS DIPSTICK OB
Bilirubin, UA: NEGATIVE
Blood, UA: NEGATIVE
Clarity, UA: NEGATIVE
Glucose, UA: NEGATIVE
Ketones, UA: NEGATIVE
Leukocytes, UA: NEGATIVE
Nitrite, UA: NEGATIVE
POC,PROTEIN,UA: NEGATIVE
Spec Grav, UA: 1.015 (ref 1.010–1.025)
Urobilinogen, UA: 0.2 E.U./dL
pH, UA: 6 (ref 5.0–8.0)

## 2021-09-30 NOTE — Progress Notes (Signed)
ROB at [redacted]w[redacted]d. Interpreter via video screen at visit. Active baby. Denies ctx, LOF, and vaginal bleeding. 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 Discussed compression socks, elevation. Reviewed s/s of blood clot. She reports vaginal dryness during intercourse. Recommend lubricant. She reports that her BV and yeast have improved. She also reports an area in her vulva that feels swollen and tender at the end of the day. Appears to be a varicosity. Her right had has been tingling and numb on occasion. Recommend wrist splint and carpal tunnel exercises. RTC in 3-4 weeks for 1-hour glucose, RPR, and CBC.  Guadlupe Spanish, CNM

## 2021-10-01 ENCOUNTER — Other Ambulatory Visit: Payer: Self-pay

## 2021-10-01 DIAGNOSIS — Z3482 Encounter for supervision of other normal pregnancy, second trimester: Secondary | ICD-10-CM

## 2021-10-01 MED ORDER — PRENATAL MULTIVITAMIN CH: 1.0000 | ORAL_TABLET | Freq: Every day | ORAL | 11 refills | Status: DC

## 2021-10-19 ENCOUNTER — Encounter: Payer: Self-pay | Admitting: Obstetrics

## 2021-10-19 ENCOUNTER — Ambulatory Visit (INDEPENDENT_AMBULATORY_CARE_PROVIDER_SITE_OTHER): Payer: BLUE CROSS/BLUE SHIELD | Admitting: Obstetrics

## 2021-10-19 ENCOUNTER — Other Ambulatory Visit: Payer: BLUE CROSS/BLUE SHIELD

## 2021-10-19 VITALS — BP 105/71 | HR 106 | Wt 175.3 lb

## 2021-10-19 DIAGNOSIS — Z23 Encounter for immunization: Secondary | ICD-10-CM

## 2021-10-19 DIAGNOSIS — Z3A27 27 weeks gestation of pregnancy: Secondary | ICD-10-CM

## 2021-10-19 LAB — POCT URINALYSIS DIPSTICK OB
Bilirubin, UA: NEGATIVE
Blood, UA: NEGATIVE
Glucose, UA: NEGATIVE
Ketones, UA: NEGATIVE
Nitrite, UA: NEGATIVE
Spec Grav, UA: 1.01 (ref 1.010–1.025)
Urobilinogen, UA: 0.2 E.U./dL
pH, UA: 7.5 (ref 5.0–8.0)

## 2021-10-19 MED ORDER — PRENATAL PLUS VITAMIN/MINERAL 27-1 MG PO TABS: 1.0000 | ORAL_TABLET | Freq: Every day | ORAL | 1 refills | Status: DC

## 2021-10-20 LAB — CBC
Hematocrit: 32.3 % — ABNORMAL LOW (ref 34.0–46.6)
Hemoglobin: 10.6 g/dL — ABNORMAL LOW (ref 11.1–15.9)
MCH: 28.2 pg (ref 26.6–33.0)
MCHC: 32.8 g/dL (ref 31.5–35.7)
MCV: 86 fL (ref 79–97)
Platelets: 220 10*3/uL (ref 150–450)
RBC: 3.76 x10E6/uL — ABNORMAL LOW (ref 3.77–5.28)
RDW: 13.3 % (ref 11.7–15.4)
WBC: 7.4 10*3/uL (ref 3.4–10.8)

## 2021-10-20 LAB — GLUCOSE, 1 HOUR GESTATIONAL: Gestational Diabetes Screen: 186 mg/dL — ABNORMAL HIGH (ref 70–139)

## 2021-10-20 LAB — RPR: RPR Ser Ql: NONREACTIVE

## 2021-10-27 ENCOUNTER — Other Ambulatory Visit: Payer: Self-pay

## 2021-10-27 DIAGNOSIS — Z131 Encounter for screening for diabetes mellitus: Secondary | ICD-10-CM

## 2021-10-28 ENCOUNTER — Other Ambulatory Visit: Payer: BLUE CROSS/BLUE SHIELD

## 2021-10-29 ENCOUNTER — Encounter: Payer: Self-pay | Admitting: Obstetrics

## 2021-10-29 ENCOUNTER — Other Ambulatory Visit: Payer: Self-pay | Admitting: Obstetrics

## 2021-10-29 DIAGNOSIS — O24410 Gestational diabetes mellitus in pregnancy, diet controlled: Secondary | ICD-10-CM

## 2021-10-29 LAB — GESTATIONAL GLUCOSE TOLERANCE
Glucose, Fasting: 113 mg/dL — ABNORMAL HIGH (ref 70–94)
Glucose, GTT - 1 Hour: 310 mg/dL — ABNORMAL HIGH (ref 70–179)
Glucose, GTT - 2 Hour: 211 mg/dL — ABNORMAL HIGH (ref 70–154)
Glucose, GTT - 3 Hour: 127 mg/dL (ref 70–139)

## 2021-10-29 MED ORDER — GLYBURIDE 2.5 MG PO TABS: 2.5000 mg | ORAL_TABLET | Freq: Every day | ORAL | 3 refills | Status: DC

## 2021-10-29 MED ORDER — BLOOD GLUCOSE MONITOR KIT: PACK | 3 refills | Status: DC

## 2021-11-04 ENCOUNTER — Encounter: Payer: Self-pay | Admitting: Obstetrics

## 2021-11-04 ENCOUNTER — Ambulatory Visit (INDEPENDENT_AMBULATORY_CARE_PROVIDER_SITE_OTHER): Payer: BLUE CROSS/BLUE SHIELD | Admitting: Obstetrics

## 2021-11-04 VITALS — BP 108/72 | HR 114 | Wt 179.1 lb

## 2021-11-04 DIAGNOSIS — O0993 Supervision of high risk pregnancy, unspecified, third trimester: Secondary | ICD-10-CM

## 2021-11-04 DIAGNOSIS — Z3483 Encounter for supervision of other normal pregnancy, third trimester: Secondary | ICD-10-CM

## 2021-11-04 NOTE — Progress Notes (Signed)
ROB at [redacted]w[redacted]d. Active baby. Kabao is feeling lots of pressure and contractions about twice a day. She reports that sometimes the baby briefly shakes 1-2 seconds. Her blood sugars have been elevated on glyburide. Fasting is typically 112. She has been checking sugars at set intervals during the day instead of in relation to meals. Instructed to check fasting then 1-2 hours after each meal and keep a log. Encouraged regular exercise and limiting sugar/carbohydrates. Discussed potential complications of uncontrolled diabetes. Has appt with diabetes educator on 11/14/21. Discussed comfort measures for leg and abdominal pain. Requests note for work hours to be limited to 40/week. RTC in one week to review blood sugars.  Guadlupe Spanish, CNM

## 2021-11-11 ENCOUNTER — Ambulatory Visit (INDEPENDENT_AMBULATORY_CARE_PROVIDER_SITE_OTHER): Payer: BLUE CROSS/BLUE SHIELD | Admitting: Student

## 2021-11-11 ENCOUNTER — Encounter: Payer: Self-pay | Admitting: Student

## 2021-11-11 VITALS — BP 115/74 | HR 112 | Wt 179.0 lb

## 2021-11-11 DIAGNOSIS — Z3A31 31 weeks gestation of pregnancy: Secondary | ICD-10-CM

## 2021-11-11 DIAGNOSIS — Z3483 Encounter for supervision of other normal pregnancy, third trimester: Secondary | ICD-10-CM

## 2021-11-11 LAB — POCT URINALYSIS DIPSTICK OB
Bilirubin, UA: NEGATIVE
Blood, UA: NEGATIVE
Glucose, UA: NEGATIVE
Ketones, UA: NEGATIVE
Leukocytes, UA: NEGATIVE
Nitrite, UA: NEGATIVE
POC,PROTEIN,UA: NEGATIVE
Spec Grav, UA: 1.02 (ref 1.010–1.025)
Urobilinogen, UA: 0.2 E.U./dL
pH, UA: 7.5 (ref 5.0–8.0)

## 2021-11-11 NOTE — Progress Notes (Unsigned)
Patient Tracy Mata is a 31 y.o. [id number]  Who was seen earlier today for prenatal visit. She is a newly diagnosed GDM; has not her education appt yet. She has a spare glucometer at her house and has been checking her sugars.   Reviewed BS from the past 7 days. Overall, only a few values out of range, majority of PP are less than 120. Difficult to tell fasting as patient does not have regular eating schedule due to starting work at 5 am, occasional fasting between 95-110.   Patients states that she has stopped drinking sodas and is eating better now.  I encouraged patient to bring her glucometer to appt next week, and to continue taking her glyburide.   Patient verbalized understanding; she will keep appt next week.   Luna Kitchens

## 2021-11-11 NOTE — Progress Notes (Addendum)
   PRENATAL VISIT NOTE  Subjective:  Tracy Mata is a 31 y.o. G3P1102 at [redacted]w[redacted]d being seen today for ongoing prenatal care.  She is currently monitored for the following issues for this high-risk pregnancy and has History of preterm premature rupture of membranes (PPROM) and History of preterm delivery on their problem list.  Patient reports  occasional contractions .   . Vag. Bleeding: None.  Movement: Present. Denies leaking of fluid.   The following portions of the patient's history were reviewed and updated as appropriate: allergies, current medications, past family history, past medical history, past social history, past surgical history and problem list.   Objective:   Vitals:   11/11/21 1411  BP: 115/74  Pulse: (!) 112  Weight: 179 lb (81.2 kg)    Fetal Status: Fetal Heart Rate (bpm): 145 Fundal Height: 34 cm Movement: Present     General:  Alert, oriented and cooperative. Patient is in no acute distress.  Skin: Skin is warm and dry. No rash noted.   Cardiovascular: Normal heart rate noted  Respiratory: Normal respiratory effort, no problems with respiration noted  Abdomen: Soft, gravid, appropriate for gestational age.  Pain/Pressure: Present     Pelvic: Cervical exam deferred        Extremities: Normal range of motion.     Mental Status: Normal mood and affect. Normal behavior. Normal judgment and thought content.   Assessment and Plan:  Pregnancy: G3P1102 at [redacted]w[redacted]d 1. Encounter for supervision of other normal pregnancy, third trimester  - POC Urinalysis Dipstick OB  Preterm labor symptoms and general obstetric precautions including but not limited to vaginal bleeding, contractions, leaking of fluid and fetal movement were reviewed in detail with the patient. Please refer to After Visit Summary for other counseling recommendations.   Return in about 1 week (around 11/18/2021).  Future Appointments  Date Time Provider Department Center  11/15/2021  3:00 PM  Sh[place], Orlean Bradford, RN [facility] None    Marylene Land, [place]

## 2021-11-15 ENCOUNTER — Encounter: Payer: Self-pay | Admitting: *Deleted

## 2021-11-15 ENCOUNTER — Encounter: Payer: BLUE CROSS/BLUE SHIELD | Attending: Obstetrics | Admitting: *Deleted

## 2021-11-15 VITALS — BP 104/70 | Ht 60.0 in | Wt 180.9 lb

## 2021-11-15 DIAGNOSIS — Z3A31 31 weeks gestation of pregnancy: Secondary | ICD-10-CM | POA: Diagnosis not present

## 2021-11-15 DIAGNOSIS — Z713 Dietary counseling and surveillance: Secondary | ICD-10-CM | POA: Diagnosis not present

## 2021-11-15 DIAGNOSIS — Z7984 Long term (current) use of oral hypoglycemic drugs: Secondary | ICD-10-CM | POA: Insufficient documentation

## 2021-11-15 DIAGNOSIS — O24410 Gestational diabetes mellitus in pregnancy, diet controlled: Secondary | ICD-10-CM | POA: Insufficient documentation

## 2021-11-15 DIAGNOSIS — O24415 Gestational diabetes mellitus in pregnancy, controlled by oral hypoglycemic drugs: Secondary | ICD-10-CM

## 2021-11-15 NOTE — Patient Instructions (Signed)
Read booklet on Gestational Diabetes Follow Gestational Meal Planning Guidelines Avoid fruit juices Complete a 3 Day Food Record and bring to next appointment Check blood sugars 4 x day - before breakfast and 2 hrs after every meal and record  Bring blood sugar log to all appointments Purchase urine ketone strips if instructed by MD and check urine ketones every am:  If + increase bedtime snack to 1 protein and 2 carbohydrate servings Walk 20-30 minutes at least 5 x week if permitted by MD

## 2021-11-16 NOTE — Progress Notes (Signed)
Diabetes Self-Management Education  Visit Type: First/Initial  Appt. Start Time: 1500 Appt. End Time: 1630  11/15/2021  Tracy Mata, identified by name and date of birth, is a 31 y.o. female with a diagnosis of Diabetes: Gestational Diabetes.   ASSESSMENT  Blood pressure 104/70, height 5' (1.524 m), weight 180 lb 14.4 oz (82.1 kg), last menstrual period 04/07/2021, estimated date of delivery 01/12/2022. Body mass index is 35.33 kg/m.   Diabetes Self-Management Education - 11/15/21 1650       Visit Information   Visit Type First/Initial      Initial Visit   Diabetes Type Gestational Diabetes    Date Diagnosed July    Are you currently following a meal plan? No    Are you taking your medications as prescribed? Yes      Health Coping   How would you rate your overall health? Good      Psychosocial Assessment   What is the hardest part about your diabetes right now, causing you the most concern, or is the most worrisome to you about your diabetes?   Making healty food and beverage choices    Self-care barriers English as a second language    Self-management support Doctor's office;Family    Other persons present Interpreter    Patient Concerns Nutrition/Meal planning;Healthy Lifestyle;Glycemic Control    Special Needs Other (comment)   Spanish materials   Preferred Learning Style Other (comment)   talking/discussion   Learning Readiness Ready    How often do you need to have someone help you when you read instructions, pamphlets, or other written materials from your doctor or pharmacy? 1 - Never   if written in Spanish   What is the last grade level you completed in school? 9th      Pre-Education Assessment   Patient understands the diabetes disease and treatment process. Needs Instruction    Patient understands incorporating nutritional management into lifestyle. Needs Instruction    Patient undertands incorporating physical activity into lifestyle. Needs  Instruction    Patient understands using medications safely. Needs Instruction    Patient understands monitoring blood glucose, interpreting and using results Needs Review    Patient understands prevention, detection, and treatment of acute complications. Needs Instruction    Patient understands prevention, detection, and treatment of chronic complications. Needs Instruction    Patient understands how to develop strategies to address psychosocial issues. Needs Instruction    Patient understands how to develop strategies to promote health/change behavior. Needs Instruction      Complications   How often do you check your blood sugar? 3-4 times/day    Fasting Blood glucose range (mg/dL) 74-259   FBG's 56-387 mg/dL - 56/43 elevated   Postprandial Blood glucose range (mg/dL) <32;95-188;416-606;301-601;>093   pp breakfast 80-119 based on 3 readings; pp lunch 64-188 mg/dL 2/35 elevated; pp supper 73-218 mg/dL 5/73 elevated   Number of hypoglycemic episodes per month 0   1 reading of 64 mg/dL prior to lunch but denies symptoms of hypoglycemia   Have you had a dilated eye exam in the past 12 months? No    Have you had a dental exam in the past 12 months? Yes    Are you checking your feet? No      Dietary Intake   Breakfast oatmeal    Snack (morning) peanut butter crackers    Lunch chicken, beef, pork; rice, beans, salad, soup, tortillas, left overs from supper    Snack (afternoon) fruit (mango, banaan,  strawberries, peach, plum)    Dinner same as lunch - also potatoes, peas, lettuce, tomatoes, onions, peppers, zucchini, squash    Beverage(s) water, occasional coffee      Activity / Exercise   Activity / Exercise Type ADL's      Patient Education   Previous Diabetes Education No    Disease Pathophysiology Definition of diabetes, type 1 and 2, and the diagnosis of diabetes;Factors that contribute to the development of diabetes    Healthy Eating Role of diet in the treatment of diabetes and the  relationship between the three main macronutrients and blood glucose level;Food label reading, portion sizes and measuring food.;Reviewed blood glucose goals for pre and post meals and how to evaluate the patients' food intake on their blood glucose level.;Meal timing in regards to the patients' current diabetes medication.    Being Active Role of exercise on diabetes management, blood pressure control and cardiac health.    Medications Reviewed patients medication for diabetes, action, purpose, timing of dose and side effects.    Monitoring Purpose and frequency of SMBG.;Taught/discussed recording of test results and interpretation of SMBG.;Ketone testing, when, how.    Chronic complications Relationship between chronic complications and blood glucose control    Diabetes Stress and Support Identified and addressed patients feelings and concerns about diabetes    Preconception care Pregnancy and GDM  Role of pre-pregnancy blood glucose control on the development of the fetus;Reviewed with patient blood glucose goals with pregnancy;Role of family planning for patients with diabetes      Individualized Goals (developed by patient)   Reducing Risk Other (comment)   improve blood sugars, lead a healthier lifestyle     Outcomes   Expected Outcomes Demonstrated interest in learning. Expect positive outcomes    Future DMSE 2 wks             Individualized Plan for Diabetes Self-Management Training:   Learning Objective:  Patient will have a greater understanding of diabetes self-management. Patient education plan is to attend individual and/or group sessions per assessed needs and concerns.   Plan:   Patient Instructions  Read booklet on Gestational Diabetes Follow Gestational Meal Planning Guidelines Avoid fruit juices Complete a 3 Day Food Record and bring to next appointment Check blood sugars 4 x day - before breakfast and 2 hrs after every meal and record  Bring blood sugar log to all  appointments Purchase urine ketone strips if instructed by MD and check urine ketones every am:  If + increase bedtime snack to 1 protein and 2 carbohydrate servings Walk 20-30 minutes at least 5 x week if permitted by MD  Expected Outcomes:  Demonstrated interest in learning. Expect positive outcomes  Education material provided:  Gestational Booklet (Spanish) Gestational Meal Planning Guidelines (Spanish) Simple Meal Plan (Spanish) 3 Day Food Record (Spanish) Goals for a Healthy Pregnancy (Spanish)  If problems or questions, patient to contact team via:   Sharion Settler, RN, CCM, CDCES [phone]  Future DSME appointment: 2 wks November 25, 2021 with the dietitian

## 2021-11-22 ENCOUNTER — Encounter: Payer: BLUE CROSS/BLUE SHIELD | Admitting: Obstetrics

## 2021-11-23 ENCOUNTER — Ambulatory Visit (INDEPENDENT_AMBULATORY_CARE_PROVIDER_SITE_OTHER): Payer: BLUE CROSS/BLUE SHIELD | Admitting: Obstetrics and Gynecology

## 2021-11-23 ENCOUNTER — Other Ambulatory Visit: Payer: Self-pay

## 2021-11-23 ENCOUNTER — Encounter: Payer: Self-pay | Admitting: Obstetrics and Gynecology

## 2021-11-23 VITALS — BP 111/72 | HR 102 | Wt 181.0 lb

## 2021-11-23 DIAGNOSIS — O24415 Gestational diabetes mellitus in pregnancy, controlled by oral hypoglycemic drugs: Secondary | ICD-10-CM

## 2021-11-23 DIAGNOSIS — Z3483 Encounter for supervision of other normal pregnancy, third trimester: Secondary | ICD-10-CM

## 2021-11-23 DIAGNOSIS — Z3A32 32 weeks gestation of pregnancy: Secondary | ICD-10-CM

## 2021-11-23 LAB — POCT URINALYSIS DIPSTICK OB
Bilirubin, UA: NEGATIVE
Blood, UA: NEGATIVE
Glucose, UA: NEGATIVE
Ketones, UA: NEGATIVE
Leukocytes, UA: NEGATIVE
Nitrite, UA: NEGATIVE
POC,PROTEIN,UA: NEGATIVE
Spec Grav, UA: 1.015 (ref 1.010–1.025)
Urobilinogen, UA: 0.2 E.U./dL
pH, UA: 6 (ref 5.0–8.0)

## 2021-11-23 MED ORDER — ACCU-CHEK SOFTCLIX LANCETS MISC: 100.0000 | Freq: Four times a day (QID) | 12 refills | Status: DC

## 2021-11-23 NOTE — Progress Notes (Signed)
Tracy Mata. Patient states she feels pressure and like"baby is going to fall out" she states she has had braxton hicks contractions as well.

## 2021-11-23 NOTE — Progress Notes (Addendum)
ROB: Has occasional contractions" 3/day with pelvic pressure".  Not regular.  Reports daily fetal movement.  Sugar log looks excellent.  Taking glyburide as directed.  Has a follow-up appointment at [facility] next week.  Size greater than dates-ultrasound ordered for growth.

## 2021-11-25 ENCOUNTER — Encounter: Payer: Self-pay | Admitting: Dietician

## 2021-11-25 ENCOUNTER — Encounter: Payer: BLUE CROSS/BLUE SHIELD | Attending: Obstetrics | Admitting: Dietician

## 2021-11-25 VITALS — BP 110/72 | Ht 60.0 in | Wt 182.4 lb

## 2021-11-25 DIAGNOSIS — O24415 Gestational diabetes mellitus in pregnancy, controlled by oral hypoglycemic drugs: Secondary | ICD-10-CM | POA: Insufficient documentation

## 2021-11-25 NOTE — Progress Notes (Signed)
Patient's BG record indicates fasting BGs ranging 86-112, higher readings on workdays when up at 4am, and post-meal BGs ranging 82-122 + one reading of 134. Patient's diet recall indicates she is eating at regular intervals, including protein sources with meals, and controlling carb intake; some post-meal BG fluctuation could be caused by inconsistent carb intake.  Provided basic balanced meal plan, and wrote individualized menus based on patient's food preferences. Instructed patient on goals for carb amounts with meals.  Instructed on appropriate treatment for low blood sugar reactions.  Instructed patient on food safety, including avoidance of Listeriosis, and limiting mercury from fish. Discussed importance of maintaining healthy lifestyle habits to reduce risk of Type 2 DM as well as Gestational DM with any future pregnancies. Advised patient to use any remaining testing supplies to test some BGs after delivery, and to have BG tested ideally annually, as well as prior to attempting future pregnancies.

## 2021-11-25 NOTE — Patient Instructions (Signed)
If you feel weak, no energy, check blood sugar. If it is under 70, drink 4 oz. Juice. Wait 10-15 minutes, and if you feel better, eat a meal or snack. If you still feel bad, check blood sugar again and if it is still low, drink another 4oz juice and then eat a snack or meal. Keep carbs to 30 - 45grams for each meal.

## 2021-11-30 ENCOUNTER — Ambulatory Visit (INDEPENDENT_AMBULATORY_CARE_PROVIDER_SITE_OTHER): Payer: BLUE CROSS/BLUE SHIELD

## 2021-11-30 ENCOUNTER — Telehealth: Payer: Self-pay | Admitting: Obstetrics and Gynecology

## 2021-11-30 DIAGNOSIS — Z3A33 33 weeks gestation of pregnancy: Secondary | ICD-10-CM

## 2021-11-30 DIAGNOSIS — O24415 Gestational diabetes mellitus in pregnancy, controlled by oral hypoglycemic drugs: Secondary | ICD-10-CM

## 2021-11-30 NOTE — Telephone Encounter (Signed)
Patient was seen today for ultrasound. Patient states she is going out of town but wants to know if it safe or not? Please advise? [phone]

## 2021-11-30 NOTE — Telephone Encounter (Signed)
Patient advised via MyChart message.

## 2021-12-07 ENCOUNTER — Encounter: Payer: Self-pay | Admitting: Obstetrics and Gynecology

## 2021-12-07 ENCOUNTER — Ambulatory Visit (INDEPENDENT_AMBULATORY_CARE_PROVIDER_SITE_OTHER): Payer: BLUE CROSS/BLUE SHIELD | Admitting: Obstetrics and Gynecology

## 2021-12-07 VITALS — BP 126/85 | HR 92 | Wt 187.2 lb

## 2021-12-07 DIAGNOSIS — Z3A34 34 weeks gestation of pregnancy: Secondary | ICD-10-CM

## 2021-12-07 DIAGNOSIS — O0993 Supervision of high risk pregnancy, unspecified, third trimester: Secondary | ICD-10-CM | POA: Insufficient documentation

## 2021-12-07 DIAGNOSIS — Z3483 Encounter for supervision of other normal pregnancy, third trimester: Secondary | ICD-10-CM

## 2021-12-07 DIAGNOSIS — O24415 Gestational diabetes mellitus in pregnancy, controlled by oral hypoglycemic drugs: Secondary | ICD-10-CM

## 2021-12-07 LAB — POCT URINALYSIS DIPSTICK OB
Bilirubin, UA: NEGATIVE
Glucose, UA: NEGATIVE
Ketones, UA: NEGATIVE
Nitrite, UA: NEGATIVE
Spec Grav, UA: 1.025 (ref 1.010–1.025)
Urobilinogen, UA: 0.2 E.U./dL
pH, UA: 6 (ref 5.0–8.0)

## 2021-12-07 NOTE — Progress Notes (Signed)
ROB: Patient complaining of lots of pelvic pressure. Denies contractions.  Discussed use of belly band.  Reports forgetting her glucose log. Recently went on vacation, notes she forgot her log and pills. This morning blood sugar was 98, yesterday was 89 postprandial. Strongly encouraged to bring log next visit. Growth scan last week, 50%ile, normal AFI. For repeat in 3 weeks. To begin NSTs. Reiterated plans for IOL at ~ 39 weeks for GDM on Glyburide. RTC in 2 weeks.

## 2021-12-07 NOTE — Progress Notes (Signed)
ROB: She feels the baby pushing down. She has been having pelvic pain and pressure. She forgot her blood sugar log, but reports sugar yesterday was 89 and it was 98 this morning.

## 2021-12-14 ENCOUNTER — Ambulatory Visit (INDEPENDENT_AMBULATORY_CARE_PROVIDER_SITE_OTHER): Payer: BLUE CROSS/BLUE SHIELD | Admitting: Certified Nurse Midwife

## 2021-12-14 ENCOUNTER — Encounter: Payer: BLUE CROSS/BLUE SHIELD | Admitting: Certified Nurse Midwife

## 2021-12-14 ENCOUNTER — Other Ambulatory Visit: Payer: BLUE CROSS/BLUE SHIELD

## 2021-12-14 ENCOUNTER — Encounter: Payer: Self-pay | Admitting: Certified Nurse Midwife

## 2021-12-14 DIAGNOSIS — O24415 Gestational diabetes mellitus in pregnancy, controlled by oral hypoglycemic drugs: Secondary | ICD-10-CM | POA: Diagnosis not present

## 2021-12-14 DIAGNOSIS — Z3A35 35 weeks gestation of pregnancy: Secondary | ICD-10-CM

## 2021-12-14 DIAGNOSIS — O0993 Supervision of high risk pregnancy, unspecified, third trimester: Secondary | ICD-10-CM

## 2021-12-14 DIAGNOSIS — Z3483 Encounter for supervision of other normal pregnancy, third trimester: Secondary | ICD-10-CM

## 2021-12-14 MED ORDER — GLYBURIDE 2.5 MG PO TABS: 2.5000 mg | ORAL_TABLET | Freq: Two times a day (BID) | ORAL | 1 refills | Status: DC

## 2021-12-14 NOTE — Progress Notes (Signed)
ROB and NST for GDM in pregnancy . BS log reviewed. 4 elevated 2 hr pp after dinner noted. Dr. Valentino Saxon consulted. Increase Glyburide to BID. Pt verbalizes and agrees.Discussed u/s for growth & AFI , NST , GBS testing, and scheduling of induction at 39 wks . She verbalizes and agrees to plan .   NST performed today was reviewed and was found to be reactive. Baseline145 with Moderate variability; No decels noted.  Continue recommended antenatal testing and prenatal care.  Doreene Burke, CNM

## 2021-12-14 NOTE — Patient Instructions (Signed)

## 2021-12-14 NOTE — Progress Notes (Signed)
ROB. Patient states  Patient states no questions or concerns at this time.   

## 2021-12-15 ENCOUNTER — Encounter: Payer: Self-pay | Admitting: Certified Nurse Midwife

## 2021-12-20 ENCOUNTER — Other Ambulatory Visit (HOSPITAL_COMMUNITY)
Admission: RE | Admit: 2021-12-20 | Discharge: 2021-12-20 | Disposition: A | Discharge status: Home / Self Care | Payer: BLUE CROSS/BLUE SHIELD | Source: Ambulatory Visit | Attending: Certified Nurse Midwife | Admitting: Certified Nurse Midwife

## 2021-12-20 ENCOUNTER — Encounter: Payer: Self-pay | Admitting: Certified Nurse Midwife

## 2021-12-20 ENCOUNTER — Other Ambulatory Visit: Payer: BLUE CROSS/BLUE SHIELD

## 2021-12-20 ENCOUNTER — Ambulatory Visit (INDEPENDENT_AMBULATORY_CARE_PROVIDER_SITE_OTHER): Payer: BLUE CROSS/BLUE SHIELD | Admitting: Certified Nurse Midwife

## 2021-12-20 ENCOUNTER — Other Ambulatory Visit: Payer: Self-pay

## 2021-12-20 DIAGNOSIS — O24415 Gestational diabetes mellitus in pregnancy, controlled by oral hypoglycemic drugs: Secondary | ICD-10-CM | POA: Diagnosis not present

## 2021-12-20 DIAGNOSIS — O0993 Supervision of high risk pregnancy, unspecified, third trimester: Secondary | ICD-10-CM | POA: Diagnosis present

## 2021-12-20 NOTE — Patient Instructions (Signed)

## 2021-12-20 NOTE — Progress Notes (Signed)
ROB and NST for GDM on medication . Her Glyburide was increased to BID at her last appointment. Pt state she forgot her BS log today but that she will send it via my chart she state her BS in the am are 80-90's and that her 2 hr pp are 110 or less.  GBS and swabs today.   NST performed today was reviewed and was found to be reactive. Baseline 150 with Moderate variability; No decels noted.  Follow up 1 wk ROB and NST with Missy .  Doreene Burke, CNM

## 2021-12-21 ENCOUNTER — Other Ambulatory Visit: Payer: BLUE CROSS/BLUE SHIELD

## 2021-12-21 ENCOUNTER — Encounter: Payer: Self-pay | Admitting: Certified Nurse Midwife

## 2021-12-22 LAB — STREP GP B NAA: Strep Gp B NAA: NEGATIVE

## 2021-12-22 LAB — GC/CHLAMYDIA PROBE AMP (~~LOC~~) NOT AT ARMC
Chlamydia: NEGATIVE
Comment: NEGATIVE
Comment: NORMAL
Neisseria Gonorrhea: NEGATIVE

## 2021-12-28 ENCOUNTER — Ambulatory Visit (INDEPENDENT_AMBULATORY_CARE_PROVIDER_SITE_OTHER): Payer: BLUE CROSS/BLUE SHIELD | Admitting: Obstetrics

## 2021-12-28 ENCOUNTER — Other Ambulatory Visit: Payer: BLUE CROSS/BLUE SHIELD

## 2021-12-28 ENCOUNTER — Encounter: Payer: Self-pay | Admitting: Obstetrics

## 2021-12-28 VITALS — BP 135/87 | HR 98 | Wt 190.0 lb

## 2021-12-28 DIAGNOSIS — Z3483 Encounter for supervision of other normal pregnancy, third trimester: Secondary | ICD-10-CM

## 2021-12-28 DIAGNOSIS — Z3A37 37 weeks gestation of pregnancy: Secondary | ICD-10-CM | POA: Diagnosis not present

## 2021-12-28 LAB — POCT URINALYSIS DIPSTICK OB
Bilirubin, UA: NEGATIVE
Blood, UA: NEGATIVE
Glucose, UA: NEGATIVE
Ketones, UA: NEGATIVE
Nitrite, UA: NEGATIVE
Spec Grav, UA: 1.015 (ref 1.010–1.025)
Urobilinogen, UA: 0.2 E.U./dL
pH, UA: 7.5 (ref 5.0–8.0)

## 2021-12-28 NOTE — Progress Notes (Signed)
ROB at [redacted]w[redacted]d. Active baby. Henryetta is having occasional BH ctx and cramps. She denies LOF and vaginal bleeding. 6 out of her last 10 fasting BS are elevated. 2 elevated PP. Reviewed with Dr. Valentino Saxon. Will increase PM dose of glyburide to 5 mg. Discussed what to expect for IOL, where to go, when to to arrive. Discussed s/s of labor and when to report to L&D.  Decklyn reports that her last baby was born shortly after AROM. Head is not engagede. Encouraged belly binding and activities to bring fetal head into pelvis. SVE FT/thick/high. RNST (see note). Growth [place] tomorrow. RTC in one week for ROB/NST.  Guadlupe Spanish, CNM

## 2021-12-29 ENCOUNTER — Ambulatory Visit (INDEPENDENT_AMBULATORY_CARE_PROVIDER_SITE_OTHER): Payer: BLUE CROSS/BLUE SHIELD

## 2021-12-29 DIAGNOSIS — O24415 Gestational diabetes mellitus in pregnancy, controlled by oral hypoglycemic drugs: Secondary | ICD-10-CM | POA: Diagnosis not present

## 2021-12-29 DIAGNOSIS — Z3A38 38 weeks gestation of pregnancy: Secondary | ICD-10-CM

## 2021-12-29 DIAGNOSIS — O0993 Supervision of high risk pregnancy, unspecified, third trimester: Secondary | ICD-10-CM

## 2021-12-31 ENCOUNTER — Telehealth: Payer: Self-pay

## 2021-12-31 NOTE — Telephone Encounter (Signed)
Pt called office to f/u on FMLA papers. Her mat leave starts next Wednesday and her supervisor just told her they still don't have papers. She wants to make sure papers say her first day away from work is 01/05/22.

## 2022-01-01 ENCOUNTER — Other Ambulatory Visit: Payer: Self-pay

## 2022-01-01 ENCOUNTER — Inpatient Hospital Stay: Payer: BLUE CROSS/BLUE SHIELD | Admitting: Anesthesiology

## 2022-01-01 ENCOUNTER — Encounter: Payer: Self-pay | Admitting: Advanced Practice Midwife

## 2022-01-01 ENCOUNTER — Inpatient Hospital Stay
Admission: EM | Admit: 2022-01-01 | Discharge: 2022-01-03 | DRG: 806 | Disposition: A | Discharge status: Home / Self Care | Payer: BLUE CROSS/BLUE SHIELD | Attending: Advanced Practice Midwife | Admitting: Advanced Practice Midwife

## 2022-01-01 DIAGNOSIS — O4290 Premature rupture of membranes, unspecified as to length of time between rupture and onset of labor, unspecified weeks of gestation: Secondary | ICD-10-CM | POA: Diagnosis present

## 2022-01-01 DIAGNOSIS — O4202 Full-term premature rupture of membranes, onset of labor within 24 hours of rupture: Secondary | ICD-10-CM

## 2022-01-01 DIAGNOSIS — D62 Acute posthemorrhagic anemia: Secondary | ICD-10-CM | POA: Diagnosis not present

## 2022-01-01 DIAGNOSIS — O9081 Anemia of the puerperium: Secondary | ICD-10-CM | POA: Diagnosis not present

## 2022-01-01 DIAGNOSIS — O26893 Other specified pregnancy related conditions, third trimester: Secondary | ICD-10-CM | POA: Diagnosis present

## 2022-01-01 DIAGNOSIS — O24415 Gestational diabetes mellitus in pregnancy, controlled by oral hypoglycemic drugs: Secondary | ICD-10-CM | POA: Diagnosis not present

## 2022-01-01 DIAGNOSIS — O24425 Gestational diabetes mellitus in childbirth, controlled by oral hypoglycemic drugs: Secondary | ICD-10-CM | POA: Diagnosis present

## 2022-01-01 DIAGNOSIS — Z3A38 38 weeks gestation of pregnancy: Secondary | ICD-10-CM | POA: Diagnosis not present

## 2022-01-01 DIAGNOSIS — O326XX0 Maternal care for compound presentation, not applicable or unspecified: Secondary | ICD-10-CM | POA: Diagnosis present

## 2022-01-01 LAB — TYPE AND SCREEN
ABO/RH(D): O POS
Antibody Screen: NEGATIVE

## 2022-01-01 LAB — CBC
HCT: 39.6 % (ref 36.0–46.0)
Hemoglobin: 13.3 g/dL (ref 12.0–15.0)
MCH: 27.8 pg (ref 26.0–34.0)
MCHC: 33.6 g/dL (ref 30.0–36.0)
MCV: 82.7 fL (ref 80.0–100.0)
Platelets: 114 10*3/uL — ABNORMAL LOW (ref 150–400)
RBC: 4.79 MIL/uL (ref 3.87–5.11)
RDW: 18.7 % — ABNORMAL HIGH (ref 11.5–15.5)
WBC: 6.7 10*3/uL (ref 4.0–10.5)
nRBC: 0 % (ref 0.0–0.2)

## 2022-01-01 LAB — GLUCOSE, CAPILLARY
Glucose-Capillary: 60 mg/dL — ABNORMAL LOW (ref 70–99)
Glucose-Capillary: 90 mg/dL (ref 70–99)
Glucose-Capillary: 78 mg/dL (ref 70–99)

## 2022-01-01 MED ORDER — MISOPROSTOL 25 MCG QUARTER TABLET
25.0000 ug | ORAL_TABLET | ORAL | Status: DC | PRN
  Administered 2022-01-01: 25 ug via VAGINAL
  Filled 2022-01-01: qty 1

## 2022-01-01 MED ORDER — DIPHENHYDRAMINE HCL 25 MG PO CAPS: 25.0000 mg | ORAL_CAPSULE | Freq: Four times a day (QID) | ORAL | Status: DC | PRN

## 2022-01-01 MED ORDER — TERBUTALINE SULFATE 1 MG/ML IJ SOLN: 0.2500 mg | Freq: Once | INTRAMUSCULAR | Status: DC | PRN

## 2022-01-01 MED ORDER — PHENYLEPHRINE 80 MCG/ML (10ML) SYRINGE FOR IV PUSH (FOR BLOOD PRESSURE SUPPORT)
80.0000 ug | PREFILLED_SYRINGE | INTRAVENOUS | Status: DC | PRN
Start: 2022-01-01 — End: 2022-01-01

## 2022-01-01 MED ORDER — PRENATAL MULTIVITAMIN CH
1.0000 | ORAL_TABLET | Freq: Every day | ORAL | Status: DC
  Administered 2022-01-02: 1 via ORAL
  Filled 2022-01-01: qty 1

## 2022-01-01 MED ORDER — DIBUCAINE (PERIANAL) 1 % EX OINT
1.0000 | TOPICAL_OINTMENT | CUTANEOUS | Status: DC | PRN
  Administered 2022-01-01: 1 via RECTAL
  Filled 2022-01-01: qty 28

## 2022-01-01 MED ORDER — WITCH HAZEL-GLYCERIN EX PADS
1.0000 | MEDICATED_PAD | CUTANEOUS | Status: DC | PRN
  Administered 2022-01-01: 1 via TOPICAL
  Filled 2022-01-01: qty 100

## 2022-01-01 MED ORDER — LACTATED RINGERS IV SOLN
500.0000 mL | Freq: Once | INTRAVENOUS | Status: AC
  Administered 2022-01-01: 500 mL via INTRAVENOUS

## 2022-01-01 MED ORDER — LIDOCAINE HCL (PF) 1 % IJ SOLN
30.0000 mL | INTRAMUSCULAR | Status: DC | PRN
  Filled 2022-01-01: qty 30

## 2022-01-01 MED ORDER — AMMONIA AROMATIC IN INHA
RESPIRATORY_TRACT | Status: AC
  Filled 2022-01-01: qty 10

## 2022-01-01 MED ORDER — ACETAMINOPHEN 325 MG PO TABS
650.0000 mg | ORAL_TABLET | ORAL | Status: DC | PRN
  Administered 2022-01-02: 650 mg via ORAL
  Filled 2022-01-01 (×2): qty 2

## 2022-01-01 MED ORDER — MISOPROSTOL 200 MCG PO TABS
ORAL_TABLET | ORAL | Status: AC
  Filled 2022-01-01: qty 4

## 2022-01-01 MED ORDER — SIMETHICONE 80 MG PO CHEW: 80.0000 mg | CHEWABLE_TABLET | ORAL | Status: DC | PRN

## 2022-01-01 MED ORDER — IBUPROFEN 600 MG PO TABS
600.0000 mg | ORAL_TABLET | Freq: Four times a day (QID) | ORAL | Status: DC
  Administered 2022-01-02: 600 mg via ORAL
  Filled 2022-01-01 (×2): qty 1

## 2022-01-01 MED ORDER — ONDANSETRON HCL 4 MG/2ML IJ SOLN: 4.0000 mg | Freq: Four times a day (QID) | INTRAMUSCULAR | Status: DC | PRN

## 2022-01-01 MED ORDER — LIDOCAINE-EPINEPHRINE (PF) 1.5 %-1:200000 IJ SOLN
INTRAMUSCULAR | Status: DC | PRN
  Administered 2022-01-01: 3 mL via EPIDURAL

## 2022-01-01 MED ORDER — LACTATED RINGERS IV SOLN: INTRAVENOUS | Status: DC

## 2022-01-01 MED ORDER — BENZOCAINE-MENTHOL 20-0.5 % EX AERO
1.0000 | INHALATION_SPRAY | CUTANEOUS | Status: DC | PRN
Start: 2022-01-01 — End: 2022-01-03
  Administered 2022-01-01: 1 via TOPICAL
  Filled 2022-01-01: qty 56

## 2022-01-01 MED ORDER — ONDANSETRON HCL 4 MG/2ML IJ SOLN: 4.0000 mg | INTRAMUSCULAR | Status: DC | PRN

## 2022-01-01 MED ORDER — LACTATED RINGERS IV SOLN
500.0000 mL | INTRAVENOUS | Status: DC | PRN
  Administered 2022-01-01: 250 mL via INTRAVENOUS

## 2022-01-01 MED ORDER — OXYTOCIN 10 UNIT/ML IJ SOLN
INTRAMUSCULAR | Status: AC
  Filled 2022-01-01: qty 2

## 2022-01-01 MED ORDER — MISOPROSTOL 25 MCG QUARTER TABLET
25.0000 ug | ORAL_TABLET | ORAL | Status: DC
  Administered 2022-01-01: 25 ug via ORAL
  Filled 2022-01-01: qty 1

## 2022-01-01 MED ORDER — FENTANYL-BUPIVACAINE-NACL 0.5-0.125-0.9 MG/250ML-% EP SOLN
12.0000 mL/h | EPIDURAL | Status: DC | PRN
  Administered 2022-01-01: 12 mL/h via EPIDURAL
  Filled 2022-01-01: qty 250

## 2022-01-01 MED ORDER — SODIUM CHLORIDE 0.9 % IV SOLN
INTRAVENOUS | Status: DC | PRN
  Administered 2022-01-01 (×2): 5 mL via EPIDURAL

## 2022-01-01 MED ORDER — EPHEDRINE 5 MG/ML INJ
10.0000 mg | INTRAVENOUS | Status: DC | PRN
Start: 2022-01-01 — End: 2022-01-01

## 2022-01-01 MED ORDER — LIDOCAINE HCL (PF) 1 % IJ SOLN
INTRAMUSCULAR | Status: DC | PRN
  Administered 2022-01-01: 1 mL via SUBCUTANEOUS

## 2022-01-01 MED ORDER — OXYTOCIN-SODIUM CHLORIDE 30-0.9 UT/500ML-% IV SOLN
1.0000 m[IU]/min | INTRAVENOUS | Status: DC
  Administered 2022-01-01: 4 m[IU]/min via INTRAVENOUS

## 2022-01-01 MED ORDER — OXYTOCIN BOLUS FROM INFUSION
333.0000 mL | Freq: Once | INTRAVENOUS | Status: AC
  Administered 2022-01-01: 333 mL via INTRAVENOUS

## 2022-01-01 MED ORDER — OXYTOCIN-SODIUM CHLORIDE 30-0.9 UT/500ML-% IV SOLN
2.5000 [IU]/h | INTRAVENOUS | Status: DC
  Administered 2022-01-01: 2.5 [IU]/h via INTRAVENOUS
  Filled 2022-01-01: qty 500

## 2022-01-01 MED ORDER — COCONUT OIL OIL: 1.0000 | TOPICAL_OIL | Status: DC | PRN

## 2022-01-01 MED ORDER — SENNOSIDES-DOCUSATE SODIUM 8.6-50 MG PO TABS
2.0000 | ORAL_TABLET | Freq: Every day | ORAL | Status: DC
  Administered 2022-01-02: 2 via ORAL
  Filled 2022-01-01: qty 2

## 2022-01-01 MED ORDER — ACETAMINOPHEN 325 MG PO TABS
650.0000 mg | ORAL_TABLET | ORAL | Status: DC | PRN
  Filled 2022-01-01: qty 2

## 2022-01-01 MED ORDER — DIPHENHYDRAMINE HCL 50 MG/ML IJ SOLN: 12.5000 mg | INTRAMUSCULAR | Status: DC | PRN

## 2022-01-01 MED ORDER — ONDANSETRON HCL 4 MG PO TABS: 4.0000 mg | ORAL_TABLET | ORAL | Status: DC | PRN

## 2022-01-01 MED ORDER — MISOPROSTOL 25 MCG QUARTER TABLET: 25.0000 ug | ORAL_TABLET | ORAL | Status: DC | PRN

## 2022-01-01 NOTE — Anesthesia Procedure Notes (Signed)
Epidural Patient location during procedure: OB Start time: 01/01/2022 5:09 PM End time: 01/01/2022 5:13 PM  Staffing Anesthesiologist: Lebert Lovern, Cleda Mccreedy, MD Performed: anesthesiologist   Preanesthetic Checklist Completed: patient identified, IV checked, site marked, risks and benefits discussed, surgical consent, monitors and equipment checked, pre-op evaluation and timeout performed  Epidural Patient position: sitting Prep: ChloraPrep Patient monitoring: heart rate, continuous pulse ox and blood pressure Approach: midline Location: L3-L4 Injection technique: LOR saline  Needle:  Needle type: Tuohy  Needle gauge: 17 G Needle length: 9 cm and 9 Needle insertion depth: 7 cm Catheter type: closed end flexible Catheter size: 19 Gauge Catheter at skin depth: 12 cm Test dose: negative and 1.5% lidocaine with Epi 1:200 K  Assessment Sensory level: T10 Events: blood not aspirated, injection not painful, no injection resistance, no paresthesia and negative IV test  Additional Notes 1 attempt Pt. Evaluated and documentation done after procedure finished. Patient identified. Risks/Benefits/Options discussed with patient including but not limited to bleeding, infection, nerve damage, paralysis, failed block, incomplete pain control, headache, blood pressure changes, nausea, vomiting, reactions to medication both or allergic, itching and postpartum back pain. Confirmed with bedside nurse the patient's most recent platelet count. Confirmed with patient that they are not currently taking any anticoagulation, have any bleeding history or any family history of bleeding disorders. Patient expressed understanding and wished to proceed. All questions were answered. Sterile technique was used throughout the entire procedure. Please see nursing notes for vital signs. Test dose was given through epidural catheter and negative prior to continuing to dose epidural or start infusion. Warning signs of high  block given to the patient including shortness of breath, tingling/numbness in hands, complete motor block, or any concerning symptoms with instructions to call for help. Patient was given instructions on fall risk and not to get out of bed. All questions and concerns addressed with instructions to call with any issues or inadequate analgesia.    Patient tolerated the insertion well without immediate complications.Reason for block:procedure for pain

## 2022-01-01 NOTE — Discharge Summary (Signed)
OB Discharge Summary     Patient Name: Tracy Mata DOB: [birth year] MRN: [id number]  Date of admission: 01/01/2022 Delivering provider: Tresea Mall, CNM  Date of Delivery: 01/01/2022  Date of discharge: 01/01/2022  Admitting diagnosis: Labor and delivery, indication for care [O75.9] Amniotic fluid leaking [O42.90] Intrauterine pregnancy: [redacted]w[redacted]d     Secondary diagnosis: Gestational Diabetes medication controlled (A2)     Discharge diagnosis: Term Pregnancy Delivered and GDM A2                                                                                                Post partum procedures:{Postpartum procedures:23558}  Augmentation: Pitocin and Cytotec  Complications: None  Hospital course:  Onset of Labor With Vaginal Delivery      31 y.o. yo P7T0626 at [redacted]w[redacted]d was admitted in Latent Labor/SROM on 01/01/2022. Patient had an uncomplicated labor course as follows:  Membrane Rupture Time/Date: 8:50 AM ,01/01/2022   Delivery Method:Vaginal, Spontaneous  Episiotomy: None  Lacerations:  None  See delivery note for details  Patient had an uncomplicated postpartum course.  She is tolerating diet, her pain is controlled with PO medication, she is ambulating and voiding without difficulty. ***   Patient is discharged home in stable condition on 01/01/22.  Newborn Data: Birth date:01/01/2022  Birth time:7:26 PM  Gender:Female  Living status:Living  Apgars:7 ,9  Weight:   Physical exam  Vitals:   01/01/22 1840 01/01/22 1902 01/01/22 1937 01/01/22 1948  BP: 116/78 114/74 119/73 (!) 144/93  Pulse: 80 74 81 92  Resp:  18    Temp:  97.6 F (36.4 C)    TempSrc:  Oral    SpO2: 98%     Weight:      Height:       General: {Exam; general:21111117} Lochia: {Desc; appropriate/inappropriate:30686::"appropriate"} Uterine Fundus: {Desc; firm/soft:30687} Incision: N/A DVT Evaluation: {Exam; dvt:2111122}  Labs: Lab Results  Component Value Date   WBC 6.7 01/01/2022   HGB 13.3  01/01/2022   HCT 39.6 01/01/2022   MCV 82.7 01/01/2022   PLT 114 (L) 01/01/2022    Discharge instruction: per After Visit Summary.  Medications:  Allergies as of 01/01/2022   No Known Allergies   Med Rec must be completed prior to using this SMARTLINK***       Diet: carb modified diet  Activity: Advance as tolerated. Pelvic rest for 6 weeks.   Outpatient follow up:  Follow-up Information     Doreene Burke, CNM. Schedule an appointment as soon as possible for a visit in 2 week(s).   Specialties: Certified Nurse Midwife, Radiology Why: 2 week telephone visit and 6 week postpartum visit/Nexplanon insertion if desired Contact information: [address] [place] [address] [phone]                   Postpartum contraception:  considering POP or Nexplanon Rhogam Given postpartum: Rh positive Rubella vaccine given postpartum: immune Varicella vaccine given postpartum: {YES NO:22349} TDaP given antepartum or postpartum: given antepartum   Newborn Delivery   Birth date/time: 01/01/2022 19:26:00 Delivery type: Vaginal, Spontaneous  Baby Feeding:  Breast and Formula  Disposition:{CHL IP OB HOME WITH MOLMBE:67544}  SIGNED:  Tresea Mall, CNM 01/01/2022 7:51 PM

## 2022-01-01 NOTE — Anesthesia Preprocedure Evaluation (Addendum)
Anesthesia Evaluation  Patient identified by MRN, date of birth, ID band Patient awake    Reviewed: Allergy & Precautions, NPO status , Patient's Chart, lab work & pertinent test results  History of Anesthesia Complications Negative for: history of anesthetic complications  Airway Mallampati: III  TM Distance: >3 FB Neck ROM: full    Dental  (+) Chipped   Pulmonary neg pulmonary ROS,    Pulmonary exam normal        Cardiovascular Exercise Tolerance: Good (-) hypertensionnegative cardio ROS Normal cardiovascular exam     Neuro/Psych    GI/Hepatic negative GI ROS,   Endo/Other  diabetes  Renal/GU   negative genitourinary   Musculoskeletal   Abdominal   Peds  Hematology negative hematology ROS (+)   Anesthesia Other Findings Past Medical History: No date: Gestational diabetes  Past Surgical History: No date: FACIAL LACERATION REPAIR No date: TONGUE SURGERY  BMI    Body Mass Index: 37.11 kg/m      Reproductive/Obstetrics (+) Pregnancy                             Anesthesia Physical Anesthesia Plan  ASA: 2  Anesthesia Plan: Epidural   Post-op Pain Management:    Induction:   PONV Risk Score and Plan:   Airway Management Planned: Natural Airway  Additional Equipment:   Intra-op Plan:   Post-operative Plan:   Informed Consent: I have reviewed the patients History and Physical, chart, labs and discussed the procedure including the risks, benefits and alternatives for the proposed anesthesia with the patient or authorized representative who has indicated his/her understanding and acceptance.     Dental Advisory Given and Interpreter used for interveiw  Plan Discussed with: Anesthesiologist  Anesthesia Plan Comments: (Patient reports no bleeding problems and no anticoagulant use.   Patient consented for risks of anesthesia including but not limited to:  - adverse  reactions to medications - risk of bleeding, infection and or nerve damage from epidural that could lead to paralysis - risk of headache or failed epidural - nerve damage due to positioning - that if epidural is used for C-section that there is a chance of epidural failure requiring spinal placement or conversion to GA - Damage to heart, brain, lungs, other parts of body or loss of life  Patient voiced understanding.)       Anesthesia Quick Evaluation

## 2022-01-01 NOTE — H&P (Signed)
OB History & Physical   History of Present Illness:  Chief Complaint: SROM this am  HPI:  Tracy Mata is a 31 y.o. [phone] female at 41w3ddated by LMP.  Her pregnancy has been complicated by GDM A2 .    She feels mild contractions.   She reports leakage of fluid at 8:50 this am.   She denies vaginal bleeding.   She reports fetal movement.    Total weight gain for pregnancy: 9.072 kg    Maternal Medical History:   Past Medical History:  Diagnosis Date   Gestational diabetes     Past Surgical History:  Procedure Laterality Date   FACIAL LACERATION REPAIR     TONGUE SURGERY      No Known Allergies  Prior to Admission medications   Medication Sig Start Date End Date Taking? Authorizing Provider  glyBURIDE (DIABETA) 2.5 MG tablet Take 1 tablet (2.5 mg total) by mouth 2 (two) times daily with a meal. 12/14/21 02/12/22 Yes TPhilip Aspen CNM  Prenatal Vit-Fe Fumarate-FA (PRENATAL PLUS VITAMIN/MINERAL) 27-1 MG TABS Take 1 tablet by mouth daily. 10/19/21  Yes Swanson, MLenna SciaraM, CNM  ACCU-CHEK GUIDE test strip 4 (four) times daily. for testing 11/24/21   [provider]  Accu-Chek Softclix Lancets lancets 100 each by Other route 4 (four) times daily. 11/23/21   EHarlin Heys MD  blood glucose meter kit and supplies KIT Dispense based on patient and insurance preference. Use up to four times daily as directed. 10/29/21   SLurlean Horns CNM    OB History  Gravida Para Term Preterm AB Living  '3 2 1 1   2  ' SAB IAB Ectopic Multiple Live Births        0 2    # Outcome Date GA Lbr Len/2nd Weight Sex Delivery Anes PTL Lv  3 Current           2 Term 01/14/18 [id number] g M Vag-Spont None  LIV  1 Preterm 2014   3941 g F Vag-Spont  Y LIV    [facility]  Social History: She  reports that she has never smoked. She has never used smokeless tobacco. She reports that she does not drink alcohol and does not use drugs.  Family  History: family history includes Diabetes in her maternal grandfather; Heart disease in her paternal grandmother.    Review of Systems:  Review of Systems  Constitutional:  Negative for chills and fever.  HENT:  Negative for congestion, ear discharge, ear pain, hearing loss, sinus pain and sore throat.   Eyes:  Negative for blurred vision and double vision.  Respiratory:  Negative for cough, shortness of breath and wheezing.   Cardiovascular:  Negative for chest pain, palpitations and leg swelling.  Gastrointestinal:  Negative for abdominal pain, blood in stool, constipation, diarrhea, heartburn, melena, nausea and vomiting.  Genitourinary:  Negative for dysuria, flank pain, frequency, hematuria and urgency.  Musculoskeletal:  Negative for back pain, joint pain and myalgias.  Skin:  Negative for itching and rash.  Neurological:  Negative for dizziness, tingling, tremors, sensory change, speech change, focal weakness, seizures, loss of consciousness, weakness and headaches.  Endo/Heme/Allergies:  Negative for environmental allergies. Does not bruise/bleed easily.  Psychiatric/Behavioral:  Negative for depression, hallucinations, memory loss, substance abuse and suicidal ideas. The patient is not nervous/anxious and does not have insomnia.      Physical Exam:  Temp 98.5 F (36.9 C) (Oral)  Resp 16   LMP 04/07/2021   Constitutional: Well nourished, well developed female in no acute distress.  HEENT: normal Skin: Warm and dry.  Cardiovascular: Regular rate and rhythm.   Extremity:  no edema   Respiratory: Clear to auscultation bilateral. Normal respiratory effort Abdomen: FHT present Neuro: DTRs 2+, Cranial nerves grossly intact Psych: Alert and Oriented x3. No memory deficits. Normal mood and affect.    Pelvic exam: per RN L. [place] 2/thick/-2   Pertinent Results:   Blood type/Rh O positive  Antibody screen negative  Rubella Immune  Varicella Not immune    RPR Non-reactive   HBsAg negative  HIV negative  GC negative  Chlamydia negative  Genetic screening NA  1 hour GTT 186  3 hour GTT 113, 310, 211, 127  GBS negative on 8/28   Baseline FHR: 150 beats/min   Variability: moderate   Accelerations: present   Decelerations: several shallow late decelerations noted  Contractions: present frequency: every 4-8  Overall assessment: category II and overall reassuring   Lab Results  Component Value Date   [place] NEGATIVE 07/19/2021    Assessment:  Tracy Mata is a 31 y.o. G63P1102 female at 29w3dwith SROM, having contractions.   Plan:  Admit to Labor & Delivery  CBC, T&S, Clrs, IVF GBS negative.   Fetal well-being: Category I currently/Category II at times, IV fluids, position changes Begin augmentation with cytotec 25/25 q 4 hours   JRod Can CNM 01/01/2022 11:32 AM

## 2022-01-01 NOTE — OB Triage Note (Signed)
SROM @ 0850 this am. Pt having towear a pad in underwear. Pad noted to be saturated with pinkish fluid. Pt denies ctx at this time.  Elaina Hoops

## 2022-01-02 LAB — CBC
HCT: 35.5 % — ABNORMAL LOW (ref 36.0–46.0)
Hemoglobin: 11.6 g/dL — ABNORMAL LOW (ref 12.0–15.0)
MCH: 27.4 pg (ref 26.0–34.0)
MCHC: 32.7 g/dL (ref 30.0–36.0)
MCV: 83.9 fL (ref 80.0–100.0)
Platelets: 139 10*3/uL — ABNORMAL LOW (ref 150–400)
RBC: 4.23 MIL/uL (ref 3.87–5.11)
RDW: 18.6 % — ABNORMAL HIGH (ref 11.5–15.5)
WBC: 8.6 10*3/uL (ref 4.0–10.5)
nRBC: 0 % (ref 0.0–0.2)

## 2022-01-02 LAB — RPR: RPR Ser Ql: NONREACTIVE

## 2022-01-02 MED ORDER — IBUPROFEN 600 MG PO TABS
600.0000 mg | ORAL_TABLET | Freq: Four times a day (QID) | ORAL | Status: DC
  Administered 2022-01-02 (×2): 600 mg via ORAL
  Filled 2022-01-02 (×2): qty 1

## 2022-01-02 MED ORDER — IBUPROFEN 600 MG PO TABS
600.0000 mg | ORAL_TABLET | Freq: Four times a day (QID) | ORAL | Status: DC
  Administered 2022-01-03 (×2): 600 mg via ORAL
  Filled 2022-01-02 (×2): qty 1

## 2022-01-02 NOTE — Progress Notes (Signed)
Subjective:  Doing well postpartum day 1: she is tolerating regular diet, her pain is controlled with PO medication. She reports some abdominal cramping and back pain. She declines stronger pain medication. She is ambulating and voiding without difficulty. She reports that newborn is not interested in latching at the breast. Lactation is aware and will visit with patient.   Objective:  Vital signs in last 24 hours: Temp:  [97.6 F (36.4 C)-99.6 F (37.6 C)] 98.1 F (36.7 C) (09/10 0810) Pulse Rate:  [68-105] 83 (09/10 0810) Resp:  [18] 18 (09/10 0810) BP: (109-144)/(65-95) 109/65 (09/10 0810) SpO2:  [97 %-99 %] 99 % (09/10 0810) Weight:  [86.2 kg] 86.2 kg (09/09 1141)    General: NAD Pulmonary: no increased work of breathing Abdomen: non-distended, non-tender, fundus firm at level of umbilicus Extremities: no edema, no erythema, no tenderness  Results for orders placed or performed during the hospital encounter of 01/01/22 (from the past 72 hour(s))  Type and screen [facility]     Status: None   Collection Time: 01/01/22 11:25 AM  Result Value Ref Range   ABO/RH(D) O POS    Antibody Screen NEG    Sample Expiration      01/04/2022,2359 Performed at [facility], [address] [place], [place] [address]   CBC     Status: Abnormal   Collection Time: 01/01/22 11:25 AM  Result Value Ref Range   WBC 6.7 4.0 - 10.5 K/uL   RBC 4.79 3.87 - 5.11 MIL/uL   Hemoglobin 13.3 12.0 - 15.0 g/dL   HCT 78.6 76.7 - 20.9 %   MCV 82.7 80.0 - 100.0 fL   MCH 27.8 26.0 - 34.0 pg   MCHC 33.6 30.0 - 36.0 g/dL   RDW 47.0 (H) 96.2 - 83.6 %   Platelets 114 (L) 150 - 400 K/uL   nRBC 0.0 0.0 - 0.2 %    Comment: Performed at [facility], [address] [place], [place] [address]  Glucose, capillary     Status: Abnormal   Collection Time: 01/01/22 11:56 AM  Result Value Ref Range   Glucose-Capillary 60 (L) 70 - 99 mg/dL    Comment: Glucose reference  range applies only to samples taken after fasting for at least 8 hours.  Glucose, capillary     Status: None   Collection Time: 01/01/22 12:32 PM  Result Value Ref Range   Glucose-Capillary 90 70 - 99 mg/dL    Comment: Glucose reference range applies only to samples taken after fasting for at least 8 hours.  Glucose, capillary     Status: None   Collection Time: 01/01/22  4:20 PM  Result Value Ref Range   Glucose-Capillary 78 70 - 99 mg/dL    Comment: Glucose reference range applies only to samples taken after fasting for at least 8 hours.  CBC     Status: Abnormal   Collection Time: 01/02/22  5:48 AM  Result Value Ref Range   WBC 8.6 4.0 - 10.5 K/uL   RBC 4.23 3.87 - 5.11 MIL/uL   Hemoglobin 11.6 (L) 12.0 - 15.0 g/dL   HCT 65.4 (L) 65.0 - 35.4 %   MCV 83.9 80.0 - 100.0 fL   MCH 27.4 26.0 - 34.0 pg   MCHC 32.7 30.0 - 36.0 g/dL   RDW 65.6 (H) 81.2 - 75.1 %   Platelets 139 (L) 150 - 400 K/uL   nRBC 0.0 0.0 - 0.2 %    Comment:  Performed at [facility], [address], [place], [place] [address]    Assessment:   31 y.o. [phone] postpartum day # 1, lactating  Plan:    1) Acute blood loss anemia - hemodynamically stable and asymptomatic - po ferrous sulfate  2) Blood Type --/--/O POS (09/09 1125) / Rubella 3.10 (04/26 1627) / Varicella Non-Immune  3) TDAP status  : given antepartum  4) Feeding plan  breast and formula  5)  Education given regarding options for contraception, as well as compatibility with breast feeding if applicable.  Patient plans on  pills or nexplanon  for contraception.  6) Disposition: continue current care, may want to discharge later after 24 hour newborn screens   Tresea Mall, CNM [facility] 01/02/2022, 11:02 AM

## 2022-01-02 NOTE — Anesthesia Postprocedure Evaluation (Signed)
Anesthesia Post Note  Patient: Tracy Mata  Procedure(s) Performed: AN AD HOC LABOR EPIDURAL  Patient location during evaluation: Mother Baby Anesthesia Type: Epidural Level of consciousness: awake and alert Pain management: pain level controlled Vital Signs Assessment: post-procedure vital signs reviewed and stable Respiratory status: spontaneous breathing, nonlabored ventilation and respiratory function stable Cardiovascular status: stable Postop Assessment: no headache, no backache, able to ambulate and adequate PO intake Anesthetic complications: no   No notable events documented.   Last Vitals:  Vitals:   01/02/22 0810 01/02/22 1245  BP: 109/65 120/80  Pulse: 83 98  Resp: 18 18  Temp: 36.7 C 36.9 C  SpO2: 99% 98%    Last Pain:  Vitals:   01/02/22 1245  TempSrc: Oral  PainSc:                  Lenard Simmer

## 2022-01-02 NOTE — Lactation Note (Signed)
This note was copied from a baby's chart. Lactation Consultation Note  Patient Name: Tracy Mata Today's Date: 01/02/2022 Reason for consult: Initial assessment;Early term 37-38.6wks;Breastfeeding assistance;RN request Age:31 hours  Maternal Data This is mom's 3rd baby, born SVD.Mom with history of gestational diabetes.Mom is an experienced breastfeeding mother. Met with mom today. Mom is concerned baby has been sleepy and she has begun supplementing due to concerns baby is not getting enough. Does the patient have breastfeeding experience prior to this delivery?: Yes How long did the patient breastfeed?: Mom attempted to breastfed her 1st baby who did not latch, she breastfed her 2nd child for 5 months. Mom reports she would have breastfed longer but after the baby's hospitalization for RSV he would not breastfeed. Mom did pump.  Feeding Mother's Current Feeding Choice: Breast Milk and Formula Assisted mother with breastfeeding. Provided tips and strategies to maximize position and latch technique.Baby did arouse briefly to achieve latch however despite encouragement of mom massaging her breast and tactile stimulation baby fell asleep.  LATCH Score Latch: Repeated attempts needed to sustain latch, nipple held in mouth throughout feeding, stimulation needed to elicit sucking reflex.  Audible Swallowing: None  Type of Nipple: Everted at rest and after stimulation  Comfort (Breast/Nipple): Soft / non-tender  Hold (Positioning): Assistance needed to correctly position infant at breast and maintain latch. (Mom prefers cradle hold. Offered mom 2 additional positions options.)  LATCH Score: 6    Interventions Interventions: Breast feeding basics reviewed;Assisted with latch;Breast massage;Adjust position;Support pillows;Position options;Education   Plan:  -Recommended mom to put baby skin to skin and observe baby for feeding cues.  -If baby doesn't show feeding signs in 30  minutes mom recommended to offer baby to breastfeed. -Recommended mom if she chooses to also supplement to insure she offers baby to breastfeed first. -Recommended mom massage breast and /or hand express as needed. -Encouraged mom to use tips and strategies to optimize position and latch techniques. -Encouraged mom to call for nurse if she needs breastfeeding assistance tonight.  Discharge Discharge Education:  (Mom plans to bring baby for follow-up at [facility]. Mom aware if she has BF questions or needs assistance once she goes home she can be assisted by [facility] lactation.) Pump: Personal;Manual  Consult Status Consult Status: Follow-up Date: 01/03/22 Follow-up type: In-patient  Update provided to care nurse.  Tracy Mata 01/02/2022, 4:00 PM

## 2022-01-03 DIAGNOSIS — Z3A38 38 weeks gestation of pregnancy: Secondary | ICD-10-CM

## 2022-01-03 DIAGNOSIS — O24415 Gestational diabetes mellitus in pregnancy, controlled by oral hypoglycemic drugs: Secondary | ICD-10-CM

## 2022-01-03 DIAGNOSIS — O4202 Full-term premature rupture of membranes, onset of labor within 24 hours of rupture: Secondary | ICD-10-CM

## 2022-01-03 MED ORDER — IBUPROFEN 600 MG PO TABS: 600.0000 mg | ORAL_TABLET | Freq: Four times a day (QID) | ORAL | 0 refills | Status: DC

## 2022-01-03 NOTE — Telephone Encounter (Signed)
FMLA currently being processed; 01/03/22

## 2022-01-03 NOTE — Progress Notes (Signed)
Pt discharged with infant.  Discharge instructions, prescriptions and follow up appointment given to and reviewed with pt. Pt verbalized understanding. Escorted out by auxillary. 

## 2022-01-05 ENCOUNTER — Encounter: Payer: Self-pay | Admitting: Obstetrics

## 2022-01-06 ENCOUNTER — Other Ambulatory Visit: Payer: Self-pay | Admitting: Certified Nurse Midwife

## 2022-01-07 ENCOUNTER — Encounter: Payer: BLUE CROSS/BLUE SHIELD | Admitting: Obstetrics

## 2022-01-07 ENCOUNTER — Other Ambulatory Visit: Payer: BLUE CROSS/BLUE SHIELD

## 2022-01-19 ENCOUNTER — Telehealth (INDEPENDENT_AMBULATORY_CARE_PROVIDER_SITE_OTHER): Payer: BLUE CROSS/BLUE SHIELD | Admitting: Certified Nurse Midwife

## 2022-01-19 DIAGNOSIS — Z1331 Encounter for screening for depression: Secondary | ICD-10-CM

## 2022-01-19 NOTE — Progress Notes (Signed)
Virtual Visit via Video Note  I connected with Tracy Mata on 01/19/22 at  9:30 AM EDT by a video enabled telemedicine application and verified that I am speaking with the correct person using two identifiers.  Location: Patient: At home Provider: at the office   I discussed the limitations of evaluation and management by telemedicine and the availability of in person appointments. The patient expressed understanding and agreed to proceed.  History of Present Illness: 2 wks status post SVD 01/01/22    Observations/Objective: Doing well, states her bleeding is decreasing , her pain is minimal , and she is breast and bottle feeding. She denies any concerns with lactation. Her mood is good.    Assessment and Plan: EPDS 0  Negative for postpartum depression   Follow Up Instructions: 4 wks for postpartum glucose screen and 6 week ppv.    I discussed the assessment and treatment plan with the patient. The patient was provided an opportunity to ask questions and all were answered. The patient agreed with the plan and demonstrated an understanding of the instructions.   The patient was advised to call back or seek an in-person evaluation if the symptoms worsen or if the condition fails to improve as anticipated.  Interpretor services used to complete visit.   I provided 10 minutes of non-face-to-face time during this encounter.   Philip Aspen, CNM

## 2022-01-20 ENCOUNTER — Other Ambulatory Visit: Payer: Self-pay | Admitting: Orthopedic Surgery

## 2022-01-20 DIAGNOSIS — M25852 Other specified joint disorders, left hip: Secondary | ICD-10-CM

## 2022-01-20 DIAGNOSIS — M25552 Pain in left hip: Secondary | ICD-10-CM

## 2022-02-15 ENCOUNTER — Encounter: Payer: Self-pay | Admitting: Certified Nurse Midwife

## 2022-02-15 ENCOUNTER — Ambulatory Visit (INDEPENDENT_AMBULATORY_CARE_PROVIDER_SITE_OTHER): Payer: Self-pay | Admitting: Certified Nurse Midwife

## 2022-02-15 ENCOUNTER — Other Ambulatory Visit: Payer: BLUE CROSS/BLUE SHIELD

## 2022-02-15 ENCOUNTER — Other Ambulatory Visit: Payer: Self-pay

## 2022-02-15 DIAGNOSIS — B3789 Other sites of candidiasis: Secondary | ICD-10-CM | POA: Insufficient documentation

## 2022-02-15 DIAGNOSIS — Z131 Encounter for screening for diabetes mellitus: Secondary | ICD-10-CM

## 2022-02-15 DIAGNOSIS — O24415 Gestational diabetes mellitus in pregnancy, controlled by oral hypoglycemic drugs: Secondary | ICD-10-CM

## 2022-02-15 MED ORDER — NYSTATIN 100000 UNIT/GM EX CREA: 1.0000 | TOPICAL_CREAM | Freq: Two times a day (BID) | CUTANEOUS | 0 refills | Status: DC

## 2022-02-15 MED ORDER — NORETHINDRONE 0.35 MG PO TABS: 1.0000 | ORAL_TABLET | Freq: Every day | ORAL | 11 refills | Status: DC

## 2022-02-15 NOTE — Progress Notes (Signed)
Subjective:    Tracy Mata is a 31 y.o. G16P2103 Hispanic female who presents for a postpartum visit. She is 6 weeks postpartum following a spontaneous vaginal delivery at 38.3 gestational weeks. Anesthesia: epidural. I have fully reviewed the prenatal and intrapartum course. Postpartum course has been normal. Baby's course has been normal. Baby is feeding by breast.She notes some pain left breast: Breasts: breasts appear normal, no suspicious masses, no skin changes or axillary nodes. Left nipple white friable tissue, tender, pt noted significant pain with latch and that infant has white tongue. Suspect yeast.  Bleeding no bleeding. Bowel function is normal. Bladder function is normal. Patient is not sexually active. Last sexual activity: prior to delivery. Contraception method is oral progesterone-only contraceptive. Postpartum depression screening: negative. Score 1.  Last pap 07/29/2021 and was negative/HPV negative.  The following portions of the patient's history were reviewed and updated as appropriate: allergies, current medications, past medical history, past surgical history and problem list.  Review of Systems Pertinent items are noted in HPI.   Vitals:   02/15/22 0935  BP: 118/82  Pulse: 88  Resp: 16  Weight: 163 lb 12.8 oz (74.3 kg)  Height: 5' (1.524 m)   No LMP recorded.  Objective:   General:  alert, cooperative and no distress   Breasts:  deferred, no complaints  Lungs: clear to auscultation bilaterally  Heart:  regular rate and rhythm  Abdomen: soft, nontender   Vulva: normal  Vagina: normal vagina  Cervix:  closed  Corpus: Well-involuted  Adnexa:  Non-palpable  Rectal Exam: no hemorrhoids        Assessment:   Postpartum exam 6 wks s/p SVD Breast feeding Nipple yeast infection Depression screening Contraception counseling   Plan:  : oral progesterone-only contraceptive, orsers placed for POP and nystatin cream to apply to affect nipple after each  feed. Pt encouraged to notify pediatrician for evaluation of infant. She will schedule 2 hr pp glucose screen.  Follow up in: 8 months for annual exam or earlier if needed  Philip Aspen, CNM

## 2022-02-16 ENCOUNTER — Other Ambulatory Visit: Payer: Self-pay

## 2022-02-17 LAB — GLUCOSE TOLERANCE, 2 HOURS
Glucose, 2 hour: 107 mg/dL (ref 70–139)
Glucose, GTT - Fasting: 89 mg/dL (ref 70–99)

## 2022-02-23 ENCOUNTER — Encounter: Payer: Self-pay | Admitting: Certified Nurse Midwife

## 2022-03-05 ENCOUNTER — Encounter: Payer: Self-pay | Admitting: Emergency Medicine

## 2022-03-05 ENCOUNTER — Other Ambulatory Visit: Payer: Self-pay

## 2022-03-05 ENCOUNTER — Emergency Department
Admission: EM | Admit: 2022-03-05 | Discharge: 2022-03-05 | Disposition: A | Discharge status: Home / Self Care | Payer: BLUE CROSS/BLUE SHIELD | Attending: Emergency Medicine | Admitting: Emergency Medicine

## 2022-03-05 DIAGNOSIS — N819 Female genital prolapse, unspecified: Secondary | ICD-10-CM | POA: Diagnosis not present

## 2022-03-05 DIAGNOSIS — N898 Other specified noninflammatory disorders of vagina: Secondary | ICD-10-CM | POA: Diagnosis present

## 2022-03-05 LAB — WET PREP, GENITAL
Clue Cells Wet Prep HPF POC: NONE SEEN
Sperm: NONE SEEN
Trich, Wet Prep: NONE SEEN
WBC, Wet Prep HPF POC: <10 (ref <10)
Yeast Wet Prep HPF POC: NONE SEEN

## 2022-03-05 LAB — CHLAMYDIA/NGC RT PCR (ARMC ONLY)
Chlamydia Tr: NOT DETECTED
N gonorrhoeae: NOT DETECTED

## 2022-03-05 NOTE — ED Triage Notes (Signed)
Per interpreter, pt reports has some flesh hanging out of her vagina. Pt reports gave birth 2 months ago and after 2 weeks postpartum is when it came out. Pt does reports some pain to her when she walks. Pt reports had a follow up 3 weeks ago and told her MD and he told her it would go back in.

## 2022-03-05 NOTE — ED Notes (Signed)
Pt to ED at 2 months postpartum complaining of what sounds like uterine/cervical prolapse. Hx of same after first birth. States has "a piece of flesh" hanging out of vagina that has become more and more pronounced since giving birth. Worse with standing and lifting. Also complains of RUQ and R side and back pain. Also states that is having clear vaginal discharge. Unsure if could be urine but denies it smelling like urine. Denies abnormal vaginal bleeding.

## 2022-03-05 NOTE — ED Provider Notes (Signed)
[facility] Provider Note    Event Date/Time   First MD Initiated Contact with Patient 03/05/22 1153     (approximate)   History   Chief Complaint Foreign Body in Vagina   HPI Tracy Mata is a 31 y.o. female, no significant medical history, presents to the emergency department for evaluation of "vaginal foreign body".  Patient states she gave birth approximately 2 months ago.  Approximately 2 weeks following birth, she states that she has had "meat" coming out of her vagina that is most prominent whenever she is walking around or lifting things.  She states that it goes back in whenever she is laying down.  In addition, she states that she has had some increased discharge as well.  Denies fever/chills, chest pain, shortness of breath, abdominal pain, vaginal bleeding, nausea/vomiting, diarrhea, dysuria, weakness, rash/lesions, dizziness/lightheadedness, or headache.  History Limitations: Spanish-speaking.        Physical Exam  Triage Vital Signs: ED Triage Vitals [03/05/22 1149]  Enc Vitals Group     BP (!) 126/90     Pulse Rate (!) 103     Resp 20     Temp 98.2 F (36.8 C)     Temp Source Oral     SpO2 97 %     Weight 163 lb 2.3 oz (74 kg)     Height 5' (1.524 m)     Head Circumference      Peak Flow      Pain Score      Pain Loc      Pain Edu?      Excl. in [place]?     Most recent vital signs: Vitals:   03/05/22 1154 03/05/22 1432  BP: (!) 126/90 121/81  Pulse: (!) 104 96  Resp: 17 15  Temp: 98.6 F (37 C)   SpO2: 98% 98%    General: Awake, NAD.  Skin: Warm, dry. No rashes or lesions.  Eyes: PERRL. Conjunctivae normal.  CV: Good peripheral perfusion.  Resp: Normal effort.  Abd: Soft, non-tender. No distention.  Neuro: At baseline. No gross neurological deficits.  Musculoskeletal: Normal ROM of all extremities.  Focused Exam: No obvious prolapse protruding from the vagina at this time as patient is supine.  Pelvic exam  does appear to show some laxity along the vaginal wall with an anterior cervix.  Mild white discharge as well.  Cervix does not appear friable.  No bleeding present.   Physical Exam    ED Results / Procedures / Treatments  Labs (all labs ordered are listed, but only abnormal results are displayed) Labs Reviewed  WET PREP, GENITAL  CHLAMYDIA/NGC RT PCR ([facility] ONLY)               EKG N/A.    RADIOLOGY  ED Provider Interpretation: N/A.  No results found.  PROCEDURES:  Critical Care performed: N/A.  Procedures    MEDICATIONS ORDERED IN ED: Medications - No data to display   IMPRESSION / MDM / ASSESSMENT AND PLAN / ED COURSE  I reviewed the triage vital signs and the nursing notes.                              Differential diagnosis includes, but is not limited to, pelvic organ prolapse, cystocele, rectocele, vaginal candidiasis, bacterial vaginosis, trichomoniasis.   Assessment/Plan Patient presents with a vaginal protrusion that occurs specifically when she walks.  It appears to  self resolve whenever she lays supine.  I suspect that she is likely experiencing pelvic organ prolapse secondary to recent birth.  She states that this is happened to her before, however was not sure if she was experiencing the same this time.  Wet prep negative for yeast, trichomonas, or clue cells.  When asked about possible STDs, she states that she has not been sexually active and has a very low suspicion for any STDs at this time.  We will provide her with a referral to OB/GYN to follow-up with.  No further work-up or treatment indicated at this time.  Provided her with educational material.  Will discharge.  Considered admission for this patient, but given her stable presentation and access to close follow-up, she will likely benefit from admission.  Provided the patient with anticipatory guidance, return precautions, and educational material. Encouraged the patient to return to the  emergency department at any time if they begin to experience any new or worsening symptoms. Patient expressed understanding and agreed with the plan.   Patient's presentation is most consistent with acute complicated illness / injury requiring diagnostic workup.       FINAL CLINICAL IMPRESSION(S) / ED DIAGNOSES   Final diagnoses:  Female genital prolapse, unspecified type     Rx / DC Orders   ED Discharge Orders     None        Note:  This document was prepared using Dragon voice recognition software and may include unintentional dictation errors.   Varney Daily, [place] 03/05/22 1551    Sharman Cheek, MD 03/07/22 [id number]

## 2022-03-05 NOTE — Discharge Instructions (Addendum)
-  Your wet prep did not show any signs of yeast or bacterial infection.  Your gonorrhea/chlamydia tests are pending, though it does not sound like you are at significant risk for this.  No medications are needed at this time.  -Please review the educational material provided and follow the recommendations.  Please schedule an appointment with the OB/GYN (Dr. Jean Rosenthal) listed in these instructions.  Please let him know that you were seen here in the emergency department.  -Return to the emergency department anytime if you begin to experience any new or worsening symptoms.

## 2022-06-13 ENCOUNTER — Ambulatory Visit
Admission: EM | Admit: 2022-06-13 | Discharge: 2022-06-13 | Disposition: A | Discharge status: Home / Self Care | Payer: BLUE CROSS/BLUE SHIELD | Attending: Family Medicine | Admitting: Family Medicine

## 2022-06-13 DIAGNOSIS — Z1152 Encounter for screening for COVID-19: Secondary | ICD-10-CM | POA: Insufficient documentation

## 2022-06-13 DIAGNOSIS — J209 Acute bronchitis, unspecified: Secondary | ICD-10-CM | POA: Insufficient documentation

## 2022-06-13 LAB — GROUP A STREP BY PCR: Group A Strep by PCR: NOT DETECTED

## 2022-06-13 LAB — SARS CORONAVIRUS 2 BY RT PCR: SARS Coronavirus 2 by RT PCR: NEGATIVE

## 2022-06-13 MED ORDER — PREDNISONE 20 MG PO TABS: 40.0000 mg | ORAL_TABLET | Freq: Every day | ORAL | 0 refills | Status: AC

## 2022-06-13 MED ORDER — AMOXICILLIN-POT CLAVULANATE 875-125 MG PO TABS: 1.0000 | ORAL_TABLET | Freq: Two times a day (BID) | ORAL | 0 refills | Status: DC

## 2022-06-13 NOTE — ED Provider Notes (Signed)
[facility]    CSN: [id number] Arrival date & time: 06/13/22  1036      History   Chief Complaint Chief Complaint  Patient presents with   Sore Throat   Emesis    HPI Tracy Mata is a 32 y.o. female.   HPI A Spanish interpreter was used for this encounter:  Name: Tracy Mata [id number]    Tracy Mata presents for ongoing cough for the past 2 weeks with sore throat and bilarateral ear pain.  Friday night her symptoms got worse. She has headache.  Vomiting only occurs with coughing. Denies fever, diarrhea, belly pain. Has chest pain with coughing.  Endorses rhinorrhea and nasal congestion which is new. No history of asthma or smoking.     Past Medical History:  Diagnosis Date   Gestational diabetes     Patient Active Problem List   Diagnosis Date Noted   Yeast infection of nipple, postpartum 02/15/2022   Gestational diabetes mellitus (GDM) in third trimester controlled on oral hypoglycemic drug 12/07/2021    Past Surgical History:  Procedure Laterality Date   FACIAL LACERATION REPAIR     TONGUE SURGERY      OB History     Gravida  3   Para  3   Term  2   Preterm  1   AB      Living  3      SAB      IAB      Ectopic      Multiple  0   Live Births  3            Home Medications    Prior to Admission medications   Medication Sig Start Date End Date Taking? Authorizing Provider  amoxicillin-clavulanate (AUGMENTIN) 875-125 MG tablet Take 1 tablet by mouth every 12 (twelve) hours. 06/13/22  Yes Tracy Bergdoll, DO  predniSONE (DELTASONE) 20 MG tablet Take 2 tablets (40 mg total) by mouth daily for 5 days. 06/13/22 06/18/22 Yes Tracy Hoeger, DO  ibuprofen (ADVIL) 600 MG tablet Take 1 tablet (600 mg total) by mouth every 6 (six) hours. 01/03/22   Imagene Riches, CNM  norethindrone (MICRONOR) 0.35 MG tablet Take 1 tablet (0.35 mg total) by mouth daily. 02/15/22   Philip Aspen, CNM  nystatin cream (MYCOSTATIN) Apply 1  Application topically 2 (two) times daily. Apply small amount to affected nipple after each feed 02/15/22   Philip Aspen, CNM  Prenatal Vit-Fe Fumarate-FA (PRENATAL PLUS VITAMIN/MINERAL) 27-1 MG TABS Take 1 tablet by mouth daily. 10/19/21   Lurlean Horns, CNM    Family History Family History  Problem Relation Age of Onset   Diabetes Maternal Grandfather    Heart disease Paternal Grandmother    Breast cancer Neg Hx    Ovarian cancer Neg Hx    Colon cancer Neg Hx     Social History Social History   Tobacco Use   Smoking status: Never   Smokeless tobacco: Never  Vaping Use   Vaping Use: Never used  Substance Use Topics   Alcohol use: No   Drug use: Never     Allergies   Patient has no known allergies.   Review of Systems Review of Systems: negative unless otherwise stated in HPI.      Physical Exam Triage Vital Signs ED Triage Vitals  Enc Vitals Group     BP 06/13/22 1135 126/74     Pulse Rate 06/13/22 1135 (!) 106     Resp --  Temp 06/13/22 1135 98.5 F (36.9 C)     Temp Source 06/13/22 1135 Oral     SpO2 06/13/22 1135 98 %     Weight --      Height 06/13/22 1128 5' (1.524 m)     Head Circumference --      Peak Flow --      Pain Score 06/13/22 1128 6     Pain Loc --      Pain Edu? --      Excl. in [place]? --    No data found.  Updated Vital Signs BP 126/74 (BP Location: Left Arm)   Pulse (!) 106   Temp 98.5 F (36.9 C) (Oral)   Ht 5' (1.524 m)   LMP 06/12/2022   SpO2 98%   Breastfeeding No   BMI 31.86 kg/m   Visual Acuity Right Eye Distance:   Left Eye Distance:   Bilateral Distance:    Right Eye Near:   Left Eye Near:    Bilateral Near:     Physical Exam GEN:     alert, non-toxic appearing female in no distress    HENT:  mucus membranes moist, oropharyngeal without lesions or exudate, no tonsillar hypertrophy,  mild oropharyngeal erythema,  clear nasal discharge, bilateral TM normal EYES:   pupils equal and reactive, no scleral  injection or discharge NECK:  normal ROM,  no meningismus   RESP:  no increased work of breathing, clear to auscultation bilaterally CVS:   regular rate and rhythm Skin:   warm and dry    UC Treatments / Results  Labs (all labs ordered are listed, but only abnormal results are displayed) Labs Reviewed  GROUP A STREP BY PCR  SARS CORONAVIRUS 2 BY RT PCR    EKG   Radiology No results found.  Procedures Procedures (including critical care time)  Medications Ordered in UC Medications - No data to display  Initial Impression / Assessment and Plan / UC Course  I have reviewed the triage vital signs and the nursing notes.  Pertinent labs & imaging results that were available during my care of the patient were reviewed by me and considered in my medical decision making (see chart for details).       Pt is a 32 y.o. female who presents for respiratory symptoms. [name] is afebrile here. Satting well on room air. Overall pt is non-toxic appearing, well hydrated, without respiratory distress. Pulmonary exam is unremarkable.  COVID testing obtained as she had a change in her symptoms on Friday . COVID was negative. Treat with Augmentin and prednisone for acute bronchitis given duration of symptoms.  Discussed symptomatic treatment.  Typical duration of symptoms discussed.   Return and ED precautions given and voiced understanding. Discussed MDM, treatment plan and plan for follow-up with patient who agrees with plan.     Final Clinical Impressions(s) / UC Diagnoses   Final diagnoses:  Acute bronchitis, unspecified organism     Discharge Instructions      Sus pruebas de estreptococo y COVID son negativas. Pasa por la farmacia a recoger tus esteroides y antibiticos. Tome los esteroides una vez al da lo antes posible. Tome los [employer] veces al da. Haga un seguimiento con su proveedor de atencin primaria segn sea necesario.  Your strep and COVID tests are negative.  Stop by the pharmacy to pick up your steroids and antibiotics. Take the steroids once a day as early as you can.  Take the antibiotics twice a day.  Follow up with your primary care provider as needed.      ED Prescriptions     Medication Sig Dispense Auth. Provider   amoxicillin-clavulanate (AUGMENTIN) 875-125 MG tablet Take 1 tablet by mouth every 12 (twelve) hours. 14 tablet Kolbee Stallman, DO   predniSONE (DELTASONE) 20 MG tablet Take 2 tablets (40 mg total) by mouth daily for 5 days. 10 tablet Lyndee Hensen, DO      PDMP not reviewed this encounter.   Lyndee Hensen, DO 06/17/22 2122

## 2022-06-13 NOTE — ED Triage Notes (Signed)
Pt c/o sore throat, phlegm, emesis, cough x2 weeks. Pt states last week was subsiding but then started having loss of voice on Friday.

## 2022-06-13 NOTE — Discharge Instructions (Addendum)
Sus pruebas de [employer] manager y COVID son negativas. Pasa por la farmacia a recoger tus esteroides y antibiticos. Tome los esteroides una vez al da lo antes posible. Tome los [employer] veces al da. Haga un seguimiento con su proveedor de atencin primaria segn sea necesario.  Your strep and COVID tests are negative. Stop by the pharmacy to pick up your steroids and antibiotics. Take the steroids once a day as early as you can.  Take the antibiotics twice a day.   Follow up with your primary care provider as needed.

## 2023-01-06 ENCOUNTER — Ambulatory Visit
Admission: EM | Admit: 2023-01-06 | Discharge: 2023-01-06 | Disposition: A | Discharge status: Home / Self Care | Payer: BLUE CROSS/BLUE SHIELD | Attending: Internal Medicine | Admitting: Internal Medicine

## 2023-01-06 DIAGNOSIS — Z1152 Encounter for screening for COVID-19: Secondary | ICD-10-CM | POA: Diagnosis not present

## 2023-01-06 DIAGNOSIS — R6889 Other general symptoms and signs: Secondary | ICD-10-CM | POA: Diagnosis present

## 2023-01-06 LAB — GROUP A STREP BY PCR: Group A Strep by PCR: NOT DETECTED

## 2023-01-06 LAB — RAPID INFLUENZA A&B ANTIGENS
Influenza A (ARMC): NEGATIVE
Influenza B (ARMC): NEGATIVE

## 2023-01-06 LAB — SARS CORONAVIRUS 2 BY RT PCR: SARS Coronavirus 2 by RT PCR: NEGATIVE

## 2023-01-06 MED ORDER — PSEUDOEPH-BROMPHEN-DM 30-2-10 MG/5ML PO SYRP: 5.0000 mL | ORAL_SOLUTION | Freq: Four times a day (QID) | ORAL | 0 refills | Status: DC | PRN

## 2023-01-06 NOTE — ED Provider Notes (Signed)
[facility]    CSN: [id number] Arrival date & time: 01/06/23  1735      History   Chief Complaint Chief Complaint  Patient presents with   Headache   Nausea   Fatigue    HPI Tracy Mata is a 32 y.o. female who presents with fatigue, decreased appetite, HA, rhinitis, ST, pressure in her eyes and has vomited  x 3 yesterday. No vomiting or nausea today. She has not been hungry today, but has been drinking some liquids. Denies UTI symptoms. She was in the ER lobby 3 days ago all night with a sick child and was well til then. Then the next morning woke up sick.     Past Medical History:  Diagnosis Date   Gestational diabetes     Patient Active Problem List   Diagnosis Date Noted   Yeast infection of nipple, postpartum 02/15/2022   Gestational diabetes mellitus (GDM) in third trimester controlled on oral hypoglycemic drug 12/07/2021    Past Surgical History:  Procedure Laterality Date   FACIAL LACERATION REPAIR     TONGUE SURGERY      OB History     Gravida  3   Para  3   Term  2   Preterm  1   AB      Living  3      SAB      IAB      Ectopic      Multiple  0   Live Births  3            Home Medications    Prior to Admission medications   Medication Sig Start Date End Date Taking? Authorizing Provider  brompheniramine-pseudoephedrine-DM 30-2-10 MG/5ML syrup Take 5 mLs by mouth 4 (four) times daily as needed. 01/06/23  Yes Rodriguez-Southworth, Nettie Elm, PA-C  ibuprofen (ADVIL) 600 MG tablet Take 1 tablet (600 mg total) by mouth every 6 (six) hours. 01/03/22   Mirna Mires, CNM  norethindrone (MICRONOR) 0.35 MG tablet Take 1 tablet (0.35 mg total) by mouth daily. 02/15/22   Doreene Burke, CNM  Prenatal Vit-Fe Fumarate-FA (PRENATAL PLUS VITAMIN/MINERAL) 27-1 MG TABS Take 1 tablet by mouth daily. 10/19/21   Glenetta Borg, CNM    Family History Family History  Problem Relation Age of Onset   Diabetes Maternal  Grandfather    Heart disease Paternal Grandmother    Breast cancer Neg Hx    Ovarian cancer Neg Hx    Colon cancer Neg Hx     Social History Social History   Tobacco Use   Smoking status: Never   Smokeless tobacco: Never  Vaping Use   Vaping status: Never Used  Substance Use Topics   Alcohol use: No   Drug use: Never     Allergies   Patient has no known allergies.   Review of Systems Review of Systems As noted in HPI  Physical Exam Triage Vital Signs ED Triage Vitals  Encounter Vitals Group     BP 01/06/23 1750 118/78     Systolic BP Percentile --      Diastolic BP Percentile --      Pulse Rate 01/06/23 1750 (!) 102     Resp 01/06/23 1750 18     Temp 01/06/23 1750 98.8 F (37.1 C)     Temp Source 01/06/23 1750 Oral     SpO2 01/06/23 1750 97 %     Weight 01/06/23 1749 158 lb (71.7 kg)  Height --      Head Circumference --      Peak Flow --      Pain Score --      Pain Loc --      Pain Education --      Exclude from Growth Chart --    No data found.  Updated Vital Signs BP 118/78 (BP Location: Right Arm)   Pulse (!) 102   Temp 98.8 F (37.1 C) (Oral)   Resp 18   Wt 158 lb (71.7 kg)   LMP 12/26/2022 (Exact Date)   SpO2 97%   BMI 30.86 kg/m   Visual Acuity Right Eye Distance:   Left Eye Distance:   Bilateral Distance:    Right Eye Near:   Left Eye Near:    Bilateral Near:     Physical Exam Physical Exam Vitals signs and nursing note reviewed.  Constitutional:      General: She is not in acute distress.    Appearance: Normal appearance. She is not ill-appearing, toxic-appearing or diaphoretic.  HENT:     Head: Normocephalic.     Right Ear: Tympanic membrane, ear canal and external ear normal.     Left Ear: Tympanic membrane, ear canal and external ear normal.     Nose: Nose normal.     Mouth/Throat:     Mouth: Mucous membranes are moist.  Eyes:     General: No scleral icterus.       Right eye: No discharge.        Left eye: No  discharge.     Conjunctiva/sclera: Conjunctivae normal.  Neck:     Musculoskeletal: Neck supple. No neck rigidity.  Cardiovascular:     Rate and Rhythm: Normal rate and regular rhythm.     Heart sounds: No murmur.  Pulmonary:     Effort: Pulmonary effort is normal.     Breath sounds: Normal breath sounds.  Abdominal:     General: Bowel sounds are normal. There is no distension.     Palpations: Abdomen is soft. There is no mass.     Tenderness: There is no abdominal tenderness. There is no guarding or rebound.     Hernia: No hernia is present.  Musculoskeletal: Normal range of motion.  Lymphadenopathy:     Cervical: No cervical adenopathy.  Skin:    General: Skin is warm and dry.     Coloration: Skin is not jaundiced.     Findings: No rash.  Neurological:     Mental Status: She is alert and oriented to person, place, and time.     Gait: Gait normal.  Psychiatric:        Mood and Affect: Mood normal.        Behavior: Behavior normal.        Thought Content: Thought content normal.        Judgment: Judgment normal.    UC Treatments / Results  Labs (all labs ordered are listed, but only abnormal results are displayed) Labs Reviewed  SARS CORONAVIRUS 2 BY RT PCR  GROUP A STREP BY PCR  RAPID INFLUENZA A&B ANTIGENS   PCR strep, flu and Covid tests are negative EKG   Radiology No results found.  Procedures Procedures (including critical care time)  Medications Ordered in UC Medications - No data to display  Initial Impression / Assessment and Plan / UC Course  I have reviewed the triage vital signs and the nursing notes.  Pertinent labs  results that were  available during my care of the patient were reviewed by me and considered in my medical decision making (see chart for details).  Flu like illness  Placed on Bromfed elixir as noted   Final Clinical Impressions(s) / UC Diagnoses   Final diagnoses:  Flu-like symptoms   Discharge Instructions   None     ED Prescriptions     Medication Sig Dispense Auth. Provider   brompheniramine-pseudoephedrine-DM 30-2-10 MG/5ML syrup Take 5 mLs by mouth 4 (four) times daily as needed. 120 mL Rodriguez-Southworth, Nettie Elm, PA-C      PDMP not reviewed this encounter.   Garey Ham, [place] 01/06/23 1915

## 2023-01-06 NOTE — ED Triage Notes (Addendum)
Sx started yesterday  Fatigue, loss of appetite. Headache,vomiting. Sore throat,eye pressure, nose running.

## 2023-04-15 IMAGING — CT CT ABD-PELV W/ CM
2 of 4 series · 16 of 46 positions shown, 18 images · IV contrast (APPLIED)
Comparison: None.

CLINICAL DATA: Left lower quadrant abdominal

EXAM:
CT ABDOMEN AND PELVIS WITH CONTRAST
TECHNIQUE: Multidetector CT imaging of the abdomen and pelvis was performed
using the standard protocol following bolus administration of
intravenous contrast.
CONTRAST:  100mL OMNIPAQUE IOHEXOL 300 MG/ML  SOLN

[Series 2: routine abd/pel with · axial · 0.87mm/px · z∈[-431,-51]mm · 13 of 85 slices shown, 15 images]
[im 5/85  soft-tissue]
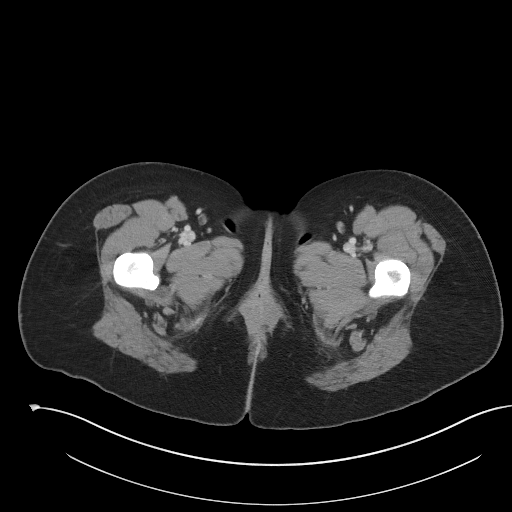
[im 5/85  bone]
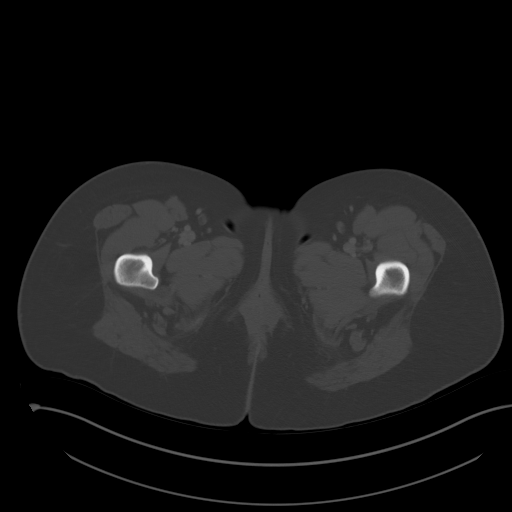
[im 13/85  soft-tissue]
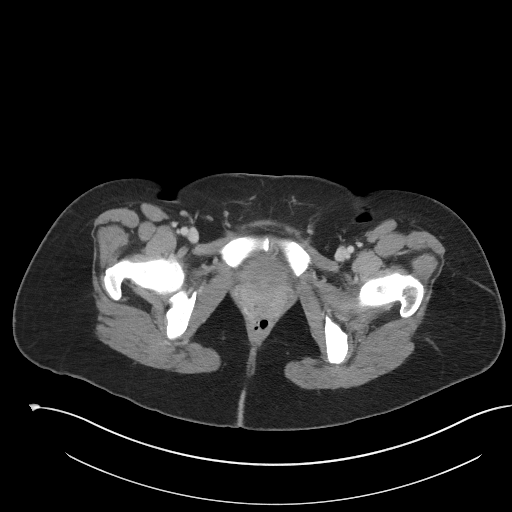
[im 17/85  soft-tissue]
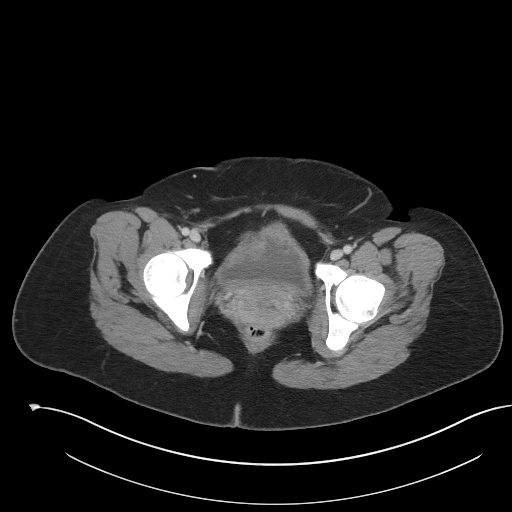
[im 25/85  soft-tissue]
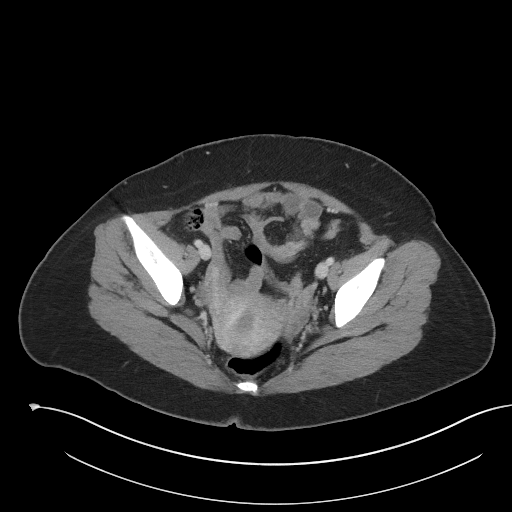
[im 29/85  soft-tissue]
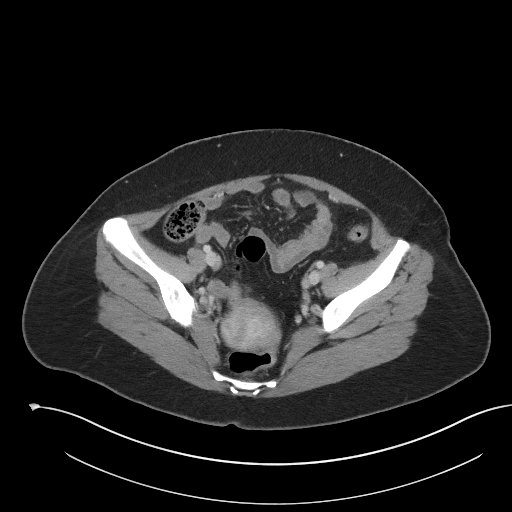
[im 37/85  soft-tissue]
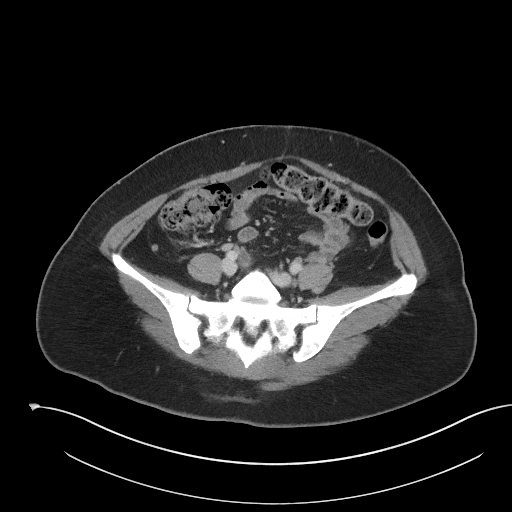
[im 45/85  soft-tissue]
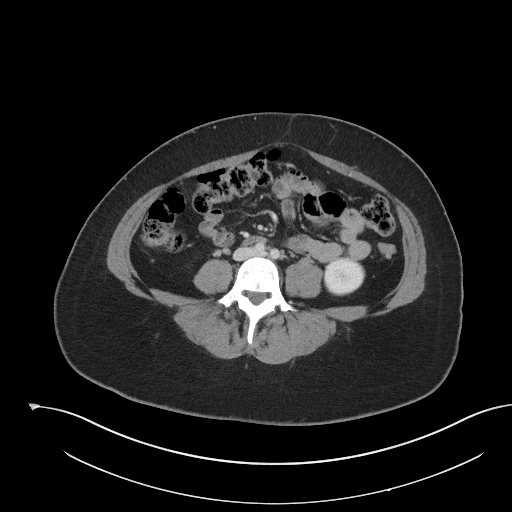
[im 49/85  soft-tissue]
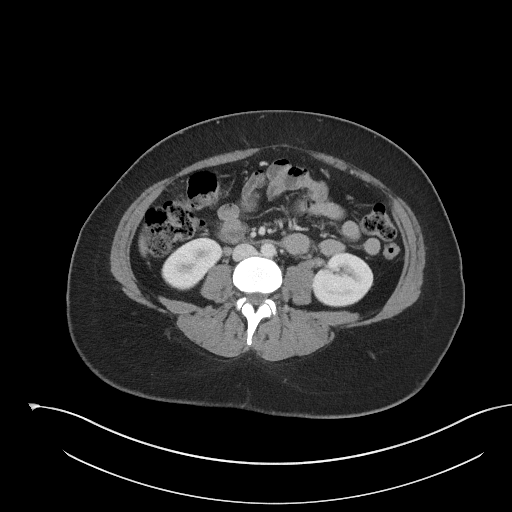
[im 57/85  soft-tissue]
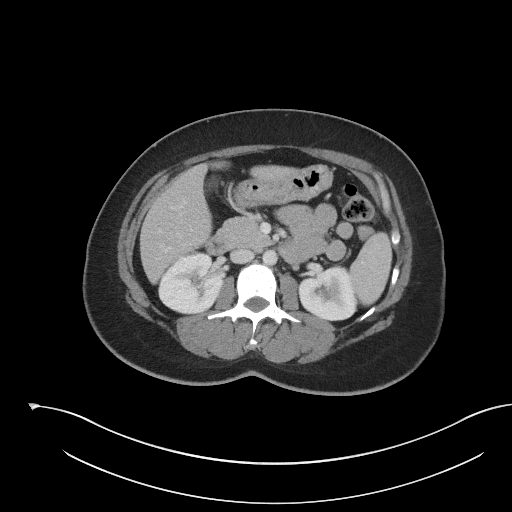
[im 57/85  bone]
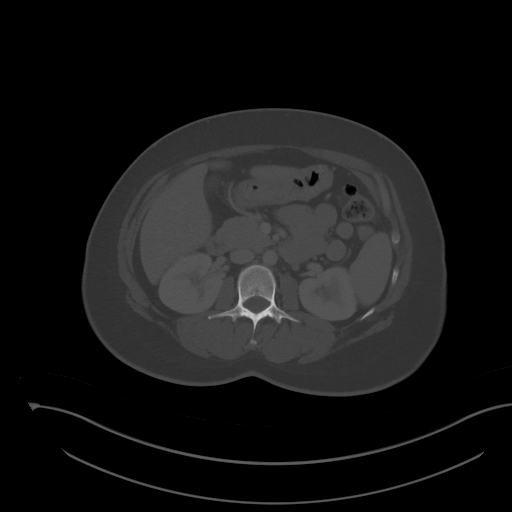
[im 61/85  soft-tissue]
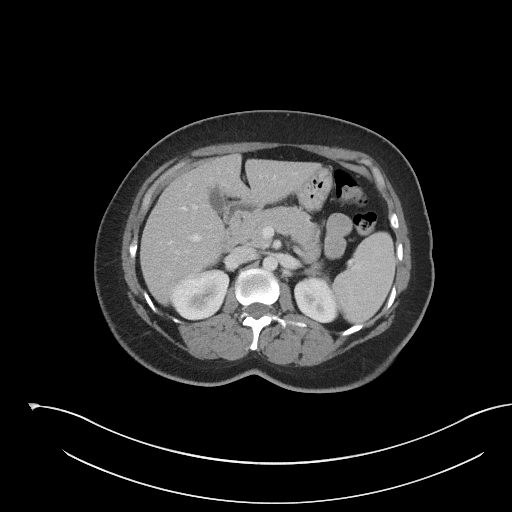
[im 69/85  soft-tissue]
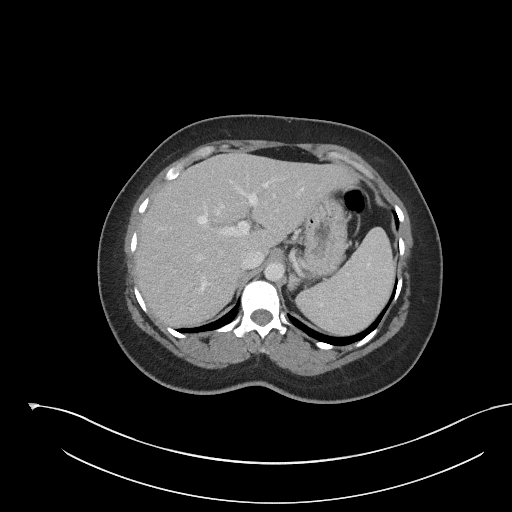
[im 73/85  soft-tissue]
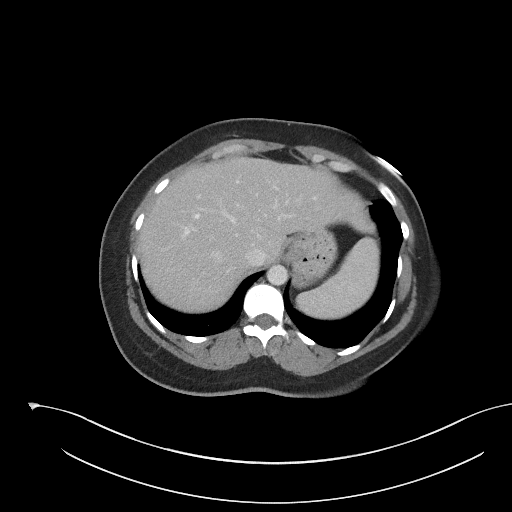
[im 81/85  soft-tissue]
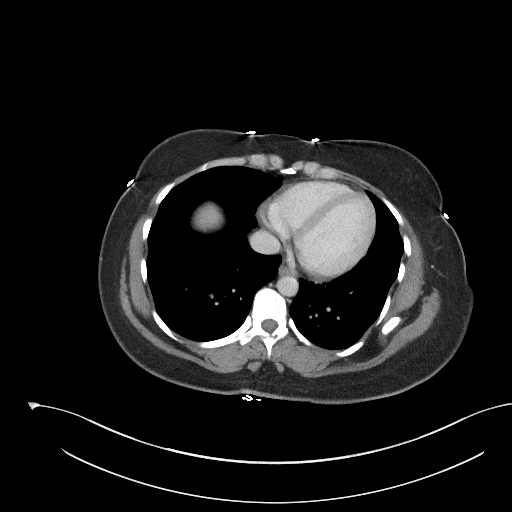

[Series 5: coronal st · coronal · 0.67mm/px · 3 of 88 slices shown]
[im 30/88  soft-tissue]
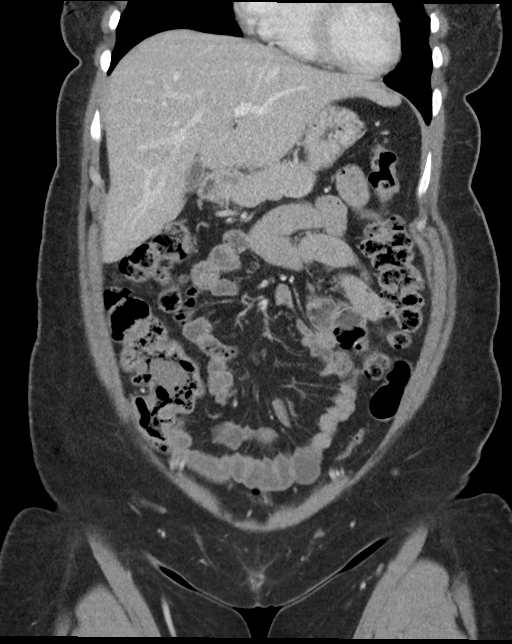
[im 39/88  soft-tissue]
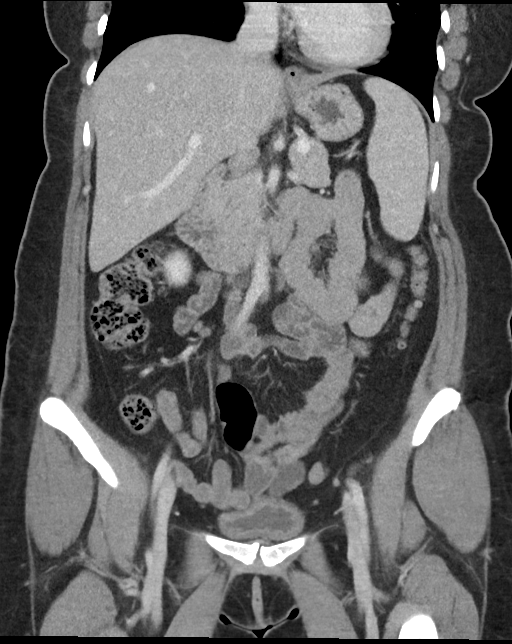
[im 49/88  soft-tissue]
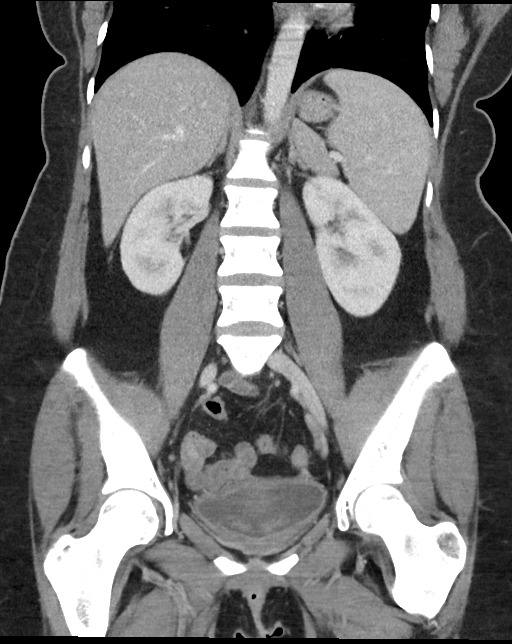

[16 of 46 positions shown; findings below may reference images not displayed]

FINDINGS: LOWER CHEST: Normal.

HEPATOBILIARY: Normal hepatic contours. No intra- or extrahepatic
biliary dilatation. The gallbladder is normal.

PANCREAS: Normal pancreas. No ductal dilatation or peripancreatic
fluid collection.

SPLEEN: Normal.

ADRENALS/URINARY TRACT: The adrenal glands are normal. No
hydronephrosis, nephroureterolithiasis or solid renal mass.
Thickening of the urinary bladder wall.

STOMACH/BOWEL: There is no hiatal hernia. Normal duodenal course and
caliber. No small bowel dilatation or inflammation. No focal colonic
abnormality. Normal appendix.

VASCULAR/LYMPHATIC: Normal course and caliber of the major abdominal
vessels. No abdominal or pelvic lymphadenopathy.

REPRODUCTIVE: Normal uterus. No adnexal mass.

MUSCULOSKELETAL. No bony spinal canal stenosis or focal osseous
abnormality.

OTHER: None.
IMPRESSION: Thickening of the urinary bladder wall, which may indicate cystitis.
Correlate with urinalysis.

## 2024-04-19 ENCOUNTER — Ambulatory Visit
Admission: EM | Admit: 2024-04-19 | Discharge: 2024-04-19 | Disposition: A | Discharge status: Home / Self Care | Attending: Nurse Practitioner | Admitting: Nurse Practitioner

## 2024-04-19 DIAGNOSIS — N912 Amenorrhea, unspecified: Secondary | ICD-10-CM | POA: Insufficient documentation

## 2024-04-19 DIAGNOSIS — J101 Influenza due to other identified influenza virus with other respiratory manifestations: Secondary | ICD-10-CM

## 2024-04-19 LAB — POCT INFLUENZA A/B
Influenza A, POC: NEGATIVE
Influenza B, POC: POSITIVE — AB

## 2024-04-19 MED ORDER — IBUPROFEN 600 MG PO TABS: 600.0000 mg | ORAL_TABLET | Freq: Three times a day (TID) | ORAL | 0 refills | Status: AC | PRN

## 2024-04-19 MED ORDER — PROMETHAZINE-DM 6.25-15 MG/5ML PO SYRP: 10.0000 mL | ORAL_SOLUTION | Freq: Four times a day (QID) | ORAL | 0 refills | Status: AC | PRN

## 2024-04-19 MED ORDER — CETIRIZINE-PSEUDOEPHEDRINE ER 5-120 MG PO TB12: 1.0000 | ORAL_TABLET | Freq: Every day | ORAL | 0 refills | Status: AC

## 2024-04-19 NOTE — ED Provider Notes (Signed)
 " [facility]    CSN: [id number] Arrival date & time: 04/19/24  1030      History   Chief Complaint Chief Complaint  Patient presents with   Fever   Chills   Generalized Body Aches    HPI Tracy Mata is a 33 y.o. female.   Discussed the use of AI scribe software for clinical note transcription with the patient, who gave verbal consent to proceed.   Tracy Mata presents with a three-day history of fever, chills, and body aches. She reports upper back and rib pain related to frequent coughing, as well as left ear pain that began today. Yesterday, she developed headache, nausea, and vomiting with associated decreased appetite. She also endorses sore throat and rhinorrhea.  She has been treating symptoms with acetaminophen  (Tylenol ) and ginger tea. She reports multiple sick contacts at home, noting that the illness was likely introduced by her two-year-old child who attends daycare. Her 81 year old daughter and 30-year-old son are also experiencing similar symptoms.  The following sections of the patient's history were reviewed and updated as appropriate: allergies, current medications, past family history, past medical history, past social history, past surgical history, and problem list.     Past Medical History:  Diagnosis Date   Gestational diabetes     Patient Active Problem List   Diagnosis Date Noted   Amenorrhea 04/19/2024   Yeast infection of nipple, postpartum 02/15/2022   Gestational diabetes mellitus (GDM) in third trimester controlled on oral hypoglycemic drug 12/07/2021    Past Surgical History:  Procedure Laterality Date   FACIAL LACERATION REPAIR     TONGUE SURGERY      OB History     Gravida  3   Para  3   Term  2   Preterm  1   AB      Living  3      SAB      IAB      Ectopic      Multiple  0   Live Births  3            Home Medications    Prior to Admission medications  Medication Sig Start Date End  Date Taking? Authorizing Provider  cetirizine -pseudoephedrine  (ZYRTEC -D) 5-120 MG tablet Take 1 tablet by mouth daily with breakfast for 10 days. 04/19/24 04/29/24 Yes Tracy Chelf, FNP  ibuprofen  (ADVIL ) 600 MG tablet Take 1 tablet (600 mg total) by mouth every 8 (eight) hours as needed (pain). Take with food to avoid stomach upset. Do not take any additional NSAIDs while on this. You may take tylenol  in addition to this if needed for extra pain relief. 04/19/24  Yes Tracy Lukes, FNP  promethazine -dextromethorphan (PROMETHAZINE -DM) 6.25-15 MG/5ML syrup Take 10 mLs by mouth every 6 (six) hours as needed for cough. 04/19/24  Yes Tracy Lukes, FNP    Family History Family History  Problem Relation Age of Onset   Diabetes Maternal Grandfather    Heart disease Paternal Grandmother    Breast cancer Neg Hx    Ovarian cancer Neg Hx    Colon cancer Neg Hx     Social History Social History[1]   Allergies   Patient has no known allergies.   Review of Systems Review of Systems  Constitutional:  Positive for appetite change (decreased) and fever.  HENT:  Positive for ear pain (left), rhinorrhea, sneezing and sore throat.   Respiratory:  Positive for cough.   Gastrointestinal:  Positive for nausea and vomiting.  Musculoskeletal:  Positive for myalgias.  Neurological:  Positive for headaches.  All other systems reviewed and are negative.    Physical Exam Triage Vital Signs ED Triage Vitals  Encounter Vitals Group     BP 04/19/24 1146 113/81     Girls Systolic BP Percentile --      Girls Diastolic BP Percentile --      Boys Systolic BP Percentile --      Boys Diastolic BP Percentile --      Pulse Rate 04/19/24 1146 (!) 105     Resp 04/19/24 1146 18     Temp 04/19/24 1146 98.2 F (36.8 C)     Temp Source 04/19/24 1146 Oral     SpO2 04/19/24 1146 98 %     Weight 04/19/24 1145 157 lb 3.2 oz (71.3 kg)     Height --      Head Circumference --      Peak Flow --       Pain Score 04/19/24 1144 0     Pain Loc --      Pain Education --      Exclude from Growth Chart --    No data found.  Updated Vital Signs BP 113/81 (BP Location: Left Arm)   Pulse (!) 105   Temp 98.2 F (36.8 C) (Oral)   Resp 18   Wt 157 lb 3.2 oz (71.3 kg)   LMP 04/12/2024 (Approximate)   SpO2 98%   BMI 30.70 kg/m   Visual Acuity Right Eye Distance:   Left Eye Distance:   Bilateral Distance:    Right Eye Near:   Left Eye Near:    Bilateral Near:     Physical Exam Vitals reviewed.  Constitutional:      General: She is awake. She is not in acute distress.    Appearance: Normal appearance. She is well-developed. She is not ill-appearing, toxic-appearing or diaphoretic.  HENT:     Head: Normocephalic.     Right Ear: Hearing, tympanic membrane, ear canal and external ear normal. No drainage, swelling or tenderness. No middle ear effusion. Tympanic membrane is not erythematous.     Left Ear: Hearing, tympanic membrane, ear canal and external ear normal. No drainage, swelling or tenderness.  No middle ear effusion. Tympanic membrane is not erythematous.     Nose: Congestion present.     Mouth/Throat:     Lips: Pink.     Mouth: Mucous membranes are moist.     Pharynx: Oropharynx is clear. Uvula midline. No pharyngeal swelling, oropharyngeal exudate, posterior oropharyngeal erythema or uvula swelling.     Tonsils: No tonsillar exudate or tonsillar abscesses.  Eyes:     General: Vision grossly intact.     Conjunctiva/sclera: Conjunctivae normal.  Cardiovascular:     Rate and Rhythm: Normal rate.     Heart sounds: Normal heart sounds.  Pulmonary:     Effort: Pulmonary effort is normal. No tachypnea or respiratory distress.     Breath sounds: Normal breath sounds and air entry.  Musculoskeletal:        General: Normal range of motion.     Cervical back: Full passive range of motion without pain, normal range of motion and neck supple.  Lymphadenopathy:     Cervical: No  cervical adenopathy.  Skin:    General: Skin is warm and dry.  Neurological:     General: No focal deficit present.     Mental Status: She is alert and oriented to person,  place, and time.  Psychiatric:        Behavior: Behavior is cooperative.      UC Treatments / Results  Labs (all labs ordered are listed, but only abnormal results are displayed) Labs Reviewed  POCT INFLUENZA A/B - Abnormal; Notable for the following components:      Result Value   Influenza B, POC Positive (*)    All other components within normal limits    EKG   Radiology No results found.  Procedures Procedures (including critical care time)  Medications Ordered in UC Medications - No data to display  Initial Impression / Assessment and Plan / UC Course  I have reviewed the triage vital signs and the nursing notes.  Pertinent labs & imaging results that were available during my care of the patient were reviewed by me and considered in my medical decision making (see chart for details).     The patient presents with acute respiratory symptoms and tested positive for influenza. Clinical presentation is consistent with acute viral illness without evidence of secondary bacterial infection. Supportive care measures, including hydration, rest, and over-the-counter antipyretics/analgesics, were reviewed. Return precautions were provided for worsening respiratory distress, persistent high fever, chest pain, or inability to tolerate fluids.  Today's evaluation has revealed no signs of a dangerous process. Discussed diagnosis with patient and/or guardian. Patient and/or guardian aware of their diagnosis, possible red flag symptoms to watch out for and need for close follow up. Patient and/or guardian understands verbal and written discharge instructions. Patient and/or guardian comfortable with plan and disposition.  Patient and/or guardian has a clear mental status at this time, good insight into illness (after  discussion and teaching) and has clear judgment to make decisions regarding their care  Documentation was completed with the aid of voice recognition software. Transcription may contain typographical errors.   Final Clinical Impressions(s) / UC Diagnoses   Final diagnoses:  Influenza B     Discharge Instructions      Usted dio positivo para influenza (gripe) hoy. Sus sntomas son consistentes con una enfermedad viral y, en este momento, no hay signos de una infeccin bacteriana. El mejor tratamiento es el cuidado de apoyo mientras su cuerpo combate el virus.  Asegrese de beber [employer], descansar lo suficiente y usar medicamentos de venta libre como acetaminofn (Tylenol ) o ibuprofeno (Motrin ) para ayudar con la fiebre, el dolor de cabeza y los dolores corporales. La mayora de las personas comienzan a actor en unos [employer], aunque la tos y el cansancio pueden durar ms [place].  D seguimiento con su proveedor de atencin primaria si no presenta mejora. Acuda a la sala de emergencias de inmediato si tiene dificultad para respirar, dolor en el pecho, fiebre que no baja con medicamentos, no puede retener lquidos, se siente inusualmente dbil o si su condicin empeora repentinamente.  You tested positive for influenza (FLU) today. Your symptoms are consistent with a viral illness, and there are no signs of a bacterial infection at this time. The best treatment is supportive care while your body fights the virus. Make sure you drink plenty of fluids, get lots of rest, and use over-the-counter medicines like Tylenol  or ibuprofen  to help with fever, headache, and body aches. Most people begin to feel better within a few days, though cough and fatigue can last longer. Please follow up with your primary care provider if you are not improving. Go to the emergency department right away if you have trouble breathing, chest pain, a  fever that does not come down with medication, cannot  keep fluids down, feel unusually weak, or if anything about your condition suddenly worsens.       ED Prescriptions     Medication Sig Dispense Auth. Provider   ibuprofen  (ADVIL ) 600 MG tablet Take 1 tablet (600 mg total) by mouth every 8 (eight) hours as needed (pain). Take with food to avoid stomach upset. Do not take any additional NSAIDs while on this. You may take tylenol  in addition to this if needed for extra pain relief. 15 tablet Tracy Lukes, FNP   promethazine -dextromethorphan (PROMETHAZINE -DM) 6.25-15 MG/5ML syrup Take 10 mLs by mouth every 6 (six) hours as needed for cough. 118 mL Harce Volden, FNP   cetirizine -pseudoephedrine  (ZYRTEC -D) 5-120 MG tablet Take 1 tablet by mouth daily with breakfast for 10 days. 10 tablet Tracy Lukes, FNP      PDMP not reviewed this encounter.     [1]  Social History Tobacco Use   Smoking status: Never   Smokeless tobacco: Never  Vaping Use   Vaping status: Never Used  Substance Use Topics   Alcohol use: No   Drug use: Never     Tracy Lukes, FNP 04/19/24 1351  "

## 2024-04-19 NOTE — Discharge Instructions (Addendum)
 Usted dio positivo para influenza (gripe) hoy. Sus sntomas son consistentes con una enfermedad viral y, en este momento, no hay signos de una infeccin bacteriana. El mejor tratamiento es el cuidado de apoyo mientras su cuerpo combate el virus.  Asegrese de beber [employer], descansar lo suficiente y usar medicamentos de venta libre como acetaminofn (Tylenol ) o ibuprofeno (Motrin ) para ayudar con la fiebre, el dolor de cabeza y los dolores corporales. La mayora de las personas comienzan a actor en unos [employer], aunque la tos y el cansancio pueden durar ms [place].  D seguimiento con su proveedor de atencin primaria si no presenta mejora. Acuda a la sala de emergencias de inmediato si tiene dificultad para respirar, dolor en el pecho, fiebre que no baja con medicamentos, no puede retener lquidos, se siente inusualmente dbil o si su condicin empeora repentinamente.  You tested positive for influenza (FLU) today. Your symptoms are consistent with a viral illness, and there are no signs of a bacterial infection at this time. The best treatment is supportive care while your body fights the virus. Make sure you drink plenty of fluids, get lots of rest, and use over-the-counter medicines like Tylenol  or ibuprofen  to help with fever, headache, and body aches. Most people begin to feel better within a few days, though cough and fatigue can last longer. Please follow up with your primary care provider if you are not improving. Go to the emergency department right away if you have trouble breathing, chest pain, a fever that does not come down with medication, cannot keep fluids down, feel unusually weak, or if anything about your condition suddenly worsens.

## 2024-04-19 NOTE — ED Triage Notes (Signed)
 Patient presents to [facility] for fever, chills, body aches, cough since Tuesday. States today she developed upper back pain, rib pain, left ear pain since today. Treating symptoms with Tylenol  and ginger tea. Last dose last night.
